# Patient Record
Sex: Male | Born: 1955 | Race: White | Hispanic: No | Marital: Married | Smoking: Never smoker
Health system: Southern US, Community
[De-identification: ages and names within clinical notes are randomized; demographics above are authoritative.]

## PROBLEM LIST (undated history)

## (undated) DIAGNOSIS — E119 Type 2 diabetes mellitus without complications: Secondary | ICD-10-CM

## (undated) DIAGNOSIS — M1990 Unspecified osteoarthritis, unspecified site: Secondary | ICD-10-CM

## (undated) DIAGNOSIS — Z791 Long term (current) use of non-steroidal anti-inflammatories (NSAID): Secondary | ICD-10-CM

## (undated) HISTORY — PX: HERNIA REPAIR: SHX51

## (undated) HISTORY — PX: HAND SURGERY: SHX662

## (undated) HISTORY — PX: OTHER SURGICAL HISTORY: SHX169

## (undated) HISTORY — DX: Long term (current) use of non-steroidal anti-inflammatories (nsaid): Z79.1

---

## 2005-10-28 ENCOUNTER — Encounter: Admission: RE | Admit: 2005-10-28 | Discharge: 2005-10-28 | Payer: Self-pay | Admitting: Orthopedic Surgery

## 2008-09-08 ENCOUNTER — Encounter: Admission: RE | Admit: 2008-09-08 | Discharge: 2008-09-08 | Payer: Self-pay | Admitting: General Surgery

## 2008-09-09 ENCOUNTER — Ambulatory Visit (HOSPITAL_BASED_OUTPATIENT_CLINIC_OR_DEPARTMENT_OTHER): Admission: RE | Admit: 2008-09-09 | Discharge: 2008-09-09 | Payer: Self-pay | Admitting: General Surgery

## 2009-04-10 ENCOUNTER — Encounter: Admission: RE | Admit: 2009-04-10 | Discharge: 2009-04-10 | Payer: Self-pay | Admitting: Orthopedic Surgery

## 2009-12-08 ENCOUNTER — Emergency Department (HOSPITAL_COMMUNITY): Admission: EM | Admit: 2009-12-08 | Discharge: 2009-12-09 | Payer: Self-pay | Admitting: Emergency Medicine

## 2010-09-01 ENCOUNTER — Encounter: Admission: RE | Admit: 2010-09-01 | Discharge: 2010-09-01 | Payer: Self-pay | Admitting: Family Medicine

## 2010-09-16 HISTORY — PX: OTHER SURGICAL HISTORY: SHX169

## 2010-10-07 ENCOUNTER — Inpatient Hospital Stay (HOSPITAL_COMMUNITY)
Admission: RE | Admit: 2010-10-07 | Discharge: 2010-10-09 | Payer: Self-pay | Source: Home / Self Care | Attending: Orthopedic Surgery | Admitting: Orthopedic Surgery

## 2010-11-09 NOTE — Discharge Summary (Signed)
NAME:  Joe Todd, Joe Todd                ACCOUNT NO.:  [id number]  MEDICAL RECORD NO.:  [id number]          PATIENT TYPE:  INP  LOCATION:  1607                         FACILITY:  [facility]  PHYSICIAN:  Nichalas Coin L. Rendall, M.D.  DATE OF BIRTH:  1955/11/07  DATE OF ADMISSION:  10/07/2010 DATE OF DISCHARGE:  10/09/2010                              DISCHARGE SUMMARY   ADMISSION DIAGNOSES: 1. End-stage osteoarthritis, left hip. 2. Obesity. 3. Borderline hypertension. 4. History of remote myocardial infarction.  DISCHARGE DIAGNOSES: 1. End-stage osteoarthritis, left hip, status post left total hip     arthroplasty. 2. Acute blood loss anemia secondary to surgery. 3. Obesity. 4. Borderline hypertension. 5. History of remote myocardial infarction.  SURGICAL PROCEDURES:  On October 07, 2010, Mr. Vitullo underwent a left total hip arthroplasty by Dr. Jonny Ruiz L. Rendall, assisted by Arnoldo Morale PA-C.  He had a DePuy Pinnacle sector II acetabular cup size 62 mm with 2 Pinnacle cancellus bone screw 6.5 x 30, two of those, with an apex hole eliminator and a pinnacle marathon acetabular liner plus 4, 10 degrees, 36 mm inner diameter, 62 mm outer diameter.  AML small stature 150 mm length, 43 mm offset size 18 femoral stem with a metal-on-metal femoral head, +8.5 neck length, 12/14 taper.  COMPLICATIONS:  None.  CONSULTS:  Occupational and Physical Therapy consult on October 08, 2010.  HISTORY OF PRESENT ILLNESS:  This 55 year old white male patient presented to Dr. Priscille Kluver with greater than 1 year history of gradual onset but stable left hip pain.  There is no known injury or prior surgery to the hip but the pain is now intermittent, sore to aching sensation to left groin without radiation.  It increases with prolonged walking and decreases with Motrin and rest.  The hip gives away on occasion and catches and keeps him up at night.  He has failed conservative treatment and because of that he is  presenting for left hip replacement.  HOSPITAL COURSE:  Mr. Giampietro tolerated the surgical procedure well without immediate postoperative complications.  He was transferred to the orthopedic floor.  On postop day #1, he was afebrile, vitals were stable.  He was doing well with ambulation.  Hemoglobin 11.2, hematocrit 33.5.  He was started on therapy per protocol.  On postop day #2, T max was 98.4, hemoglobin 10.7, hematocrit 32.3.  His dressing was changed.  Leg was neurovascularly intact.  He was doing well enough with therapy.  It was felt he was ready for DC home and was DC'ed home later that day.  DISCHARGE INSTRUCTIONS:  DIET:  He is to resume his regular prehospitalization diet.  MEDICATIONS:  Please see the home discharge med rec sheet for complete documentation of his medications but we did add Celebrex 200 mg, Robaxin 500 mg, Percocet 5/325, and Xarelto 10 mg.  He was initially started on the Xarelto for 12 days after surgery.  WOUND CARE:  Please see the white total joint discharge sheet for further wound care instructions.  ACTIVITY:  He can increase activity slowly.  He may go up and down steps.  He can use crutches  and walker.  No lifting or driving for 6 weeks and he can be weightbearing as tolerated on the left leg with use of a walker.  Please see the white total joint discharge sheet for further activity instructions.  FOLLOWUP:  He needs to follow up with Dr. Priscille Kluver in our office on Tuesday, October 19, 2010, and needs to call [phone] for that appointment.  He is arranged for home health PT per [facility].  LABORATORY DATA:  Hemoglobin and hematocrit ranged from 13.9 and 41 on the 14th to 10.7 and 32.3 on the 24th.  White count and platelets were within normal limits.  Glucose ranged from 192 on the 14th to 144 on the 24th.  Calcium dropped to a low of 8.3 on the 23rd and then to 8.1 on the 24th.  All other laboratory studies were within  normal limits.  X-ray taken of the left hip on October 07, 2010, showed satisfactory position of the left total hip.     Legrand Pitts Duffy, P.A.   ______________________________ Carlisle Beers. Priscille Kluver, M.D.    KED/MEDQ  D:  11/03/2010  T:  11/03/2010  Job:  [id number]  Electronically Signed by Arnoldo Morale P.A. on 11/09/2010 08:12:57 AM Electronically Signed by Erasmo Leventhal M.D. on 11/09/2010 09:38:28 AM

## 2010-12-27 LAB — CROSSMATCH: Antibody Screen: NEGATIVE

## 2010-12-27 LAB — BASIC METABOLIC PANEL
BUN: 11 mg/dL (ref 6–23)
Chloride: 102 mEq/L (ref 96–112)
GFR calc Af Amer: >60 mL/min (ref >60)
GFR calc non Af Amer: >60 mL/min (ref >60)
GFR calc non Af Amer: >60 mL/min (ref >60)
Glucose, Bld: 144 mg/dL — ABNORMAL HIGH (ref 70–99)
Potassium: 3.9 mEq/L (ref 3.5–5.1)
Potassium: 4.4 mEq/L (ref 3.5–5.1)
Sodium: 135 mEq/L (ref 135–145)

## 2010-12-27 LAB — CBC
HCT: 32.3 % — ABNORMAL LOW (ref 39.0–52.0)
HCT: 33.5 % — ABNORMAL LOW (ref 39.0–52.0)
Hemoglobin: 10.7 g/dL — ABNORMAL LOW (ref 13.0–17.0)
Platelets: 192 10*3/uL (ref 150–400)
RBC: 3.52 MIL/uL — ABNORMAL LOW (ref 4.22–5.81)
RDW: 12.9 % (ref 11.5–15.5)
WBC: 7.2 10*3/uL (ref 4.0–10.5)
WBC: 8.7 10*3/uL (ref 4.0–10.5)

## 2010-12-27 LAB — ABO/RH: ABO/RH(D): O NEG

## 2010-12-27 LAB — GLUCOSE, CAPILLARY

## 2010-12-28 LAB — SURGICAL PCR SCREEN
MRSA, PCR: NEGATIVE
Staphylococcus aureus: NEGATIVE

## 2010-12-28 LAB — COMPREHENSIVE METABOLIC PANEL
ALT: 66 U/L — ABNORMAL HIGH (ref 0–53)
AST: 34 U/L (ref 0–37)
Alkaline Phosphatase: 87 U/L (ref 39–117)
CO2: 26 mEq/L (ref 19–32)
Calcium: 9.1 mg/dL (ref 8.4–10.5)
Chloride: 108 mEq/L (ref 96–112)
GFR calc Af Amer: >60 mL/min (ref >60)
Sodium: 141 mEq/L (ref 135–145)
Total Bilirubin: 0.7 mg/dL (ref 0.3–1.2)

## 2010-12-28 LAB — APTT: aPTT: 26 seconds (ref 24–37)

## 2010-12-28 LAB — CBC
Hemoglobin: 13.9 g/dL (ref 13.0–17.0)
MCHC: 33.9 g/dL (ref 30.0–36.0)
RBC: 4.27 MIL/uL (ref 4.22–5.81)
WBC: 7.1 10*3/uL (ref 4.0–10.5)

## 2010-12-28 LAB — PROTIME-INR: INR: 0.93 (ref 0.00–1.49)

## 2010-12-28 LAB — URINALYSIS, ROUTINE W REFLEX MICROSCOPIC
Glucose, UA: 250 mg/dL — AB
Hgb urine dipstick: NEGATIVE
Specific Gravity, Urine: 1.028 (ref 1.005–1.030)

## 2010-12-28 LAB — DIFFERENTIAL
Basophils Absolute: 0 10*3/uL (ref 0.0–0.1)
Basophils Relative: 0 % (ref 0–1)
Lymphocytes Relative: 24 % (ref 12–46)
Monocytes Absolute: 0.6 10*3/uL (ref 0.1–1.0)
Monocytes Relative: 8 % (ref 3–12)
Neutro Abs: 4.6 10*3/uL (ref 1.7–7.7)
Neutrophils Relative %: 65 % (ref 43–77)

## 2011-03-01 NOTE — Op Note (Signed)
NAME:  Joe Todd, Joe Todd NO.:  [id number]   MEDICAL RECORD NO.:  [id number]          PATIENT TYPE:  AMB   LOCATION:  DSC                          FACILITY:  [facility]   PHYSICIAN:  Adolph Pollack, M.D.DATE OF BIRTH:  [birth year]   DATE OF PROCEDURE:  DATE OF DISCHARGE:                               OPERATIVE REPORT   PREOPERATIVE DIAGNOSIS:  Umbilical hernia (symptomatic).   POSTOPERATIVE DIAGNOSIS:  Umbilical hernia (symptomatic).   PROCEDURE:  Umbilical repair with mesh.   SURGEON:  Adolph Pollack, MD   ANESTHESIA:  General plus Marcaine local.   INDICATIONS:  This 55 year old man who has had umbilical bulge that is  becoming more uncomfortable for him.  It is reducible and consistent  with umbilical hernia.  He now presents for repair.  We discussed the  procedure risks and aftercare preoperatively.   TECHNIQUE:  He was brought to the operating room and placed supine on  the operating table and general anesthetic was administered.  The hair  in the periumbilical area was clipped and the area was sterilely prepped  and draped.  I did a periumbilical block using Marcaine solution  superficially and deep.  A curvilinear infraumbilical incision wound was  made dividing the skin and subcutaneous tissue down to the fascial  level.  I then dissected the umbilicus free from the fascia as the  hernia defect was just to the right of it.  I then exposed the hernia  defect and using electrocautery, I dissected the subcutaneous tissue off  the fascia for a distance about 4 cm around the hernia defect.  The  hernia defect measured about 2-2.5 cm.   I then primarily closed the hernia defect with 0 Surgilon sutures  leaving them long.  I brought a piece of 3 x 6 inch polypropylene mesh  into the field and cut it to appropriate size to allow for 4 cm of  overlap over the primary repair.  I then threaded the sutures up through  the mesh and tied the sutures down  anchoring the mesh directly over the  primary repair.  Following this, I used 0 Prolene to anchor the  periphery of the mesh to the fascia in a running fashion.  I then  trimmed the excess mesh.   I then anesthetized the fascia with Marcaine solution.  Hemostasis was  adequate.  The umbilicus was reattached the mesh with 3-0 Vicryl suture.  The subcutaneous tissue was closed over the mesh with a running 3-0  Vicryl suture.  The skin was closed with 4-0 Monocryl subcuticular  stitch.  Steri-Strips and sterile dressing were applied.   He tolerated the procedure without apparent complications and was taken  to the recovery room in satisfactory condition.      Adolph Pollack, M.D.  Electronically Signed     TJR/MEDQ  D:  09/09/2008  T:  09/09/2008  Job:  [id number]   cc:   Oley Balm. Georgina Pillion, M.D.

## 2011-07-20 LAB — DIFFERENTIAL
Basophils Absolute: 0
Basophils Relative: 0
Lymphocytes Relative: 23
Monocytes Absolute: 0.5
Neutro Abs: 4.3
Neutrophils Relative %: 68

## 2011-07-20 LAB — CBC
HCT: 46.4
Platelets: 257
RDW: 13.6
WBC: 6.4

## 2011-07-20 LAB — COMPREHENSIVE METABOLIC PANEL
ALT: 102 — ABNORMAL HIGH
AST: 49 — ABNORMAL HIGH
Albumin: 4
Alkaline Phosphatase: 77
Chloride: 105
Potassium: 4.2
Sodium: 139
Total Bilirubin: 1
Total Protein: 6.6

## 2011-07-20 LAB — POCT HEMOGLOBIN-HEMACUE: Hemoglobin: 14.9

## 2012-09-27 ENCOUNTER — Emergency Department (HOSPITAL_COMMUNITY)
Admission: EM | Admit: 2012-09-27 | Discharge: 2012-09-27 | Disposition: A | Discharge status: Home / Self Care | Payer: Managed Care, Other (non HMO) | Attending: Emergency Medicine | Admitting: Emergency Medicine

## 2012-09-27 ENCOUNTER — Emergency Department (HOSPITAL_COMMUNITY): Payer: Managed Care, Other (non HMO)

## 2012-09-27 ENCOUNTER — Encounter (HOSPITAL_COMMUNITY): Payer: Self-pay | Admitting: *Deleted

## 2012-09-27 DIAGNOSIS — M25549 Pain in joints of unspecified hand: Secondary | ICD-10-CM | POA: Insufficient documentation

## 2012-09-27 DIAGNOSIS — Z791 Long term (current) use of non-steroidal anti-inflammatories (NSAID): Secondary | ICD-10-CM | POA: Insufficient documentation

## 2012-09-27 DIAGNOSIS — R079 Chest pain, unspecified: Secondary | ICD-10-CM

## 2012-09-27 LAB — CBC
Hemoglobin: 14.9 g/dL (ref 13.0–17.0)
MCH: 30.8 pg (ref 26.0–34.0)
Platelets: 239 10*3/uL (ref 150–400)
RBC: 4.84 MIL/uL (ref 4.22–5.81)
WBC: 6.2 10*3/uL (ref 4.0–10.5)

## 2012-09-27 LAB — BASIC METABOLIC PANEL
CO2: 27 mEq/L (ref 19–32)
Calcium: 9.4 mg/dL (ref 8.4–10.5)
Glucose, Bld: 101 mg/dL — ABNORMAL HIGH (ref 70–99)
Potassium: 4.4 mEq/L (ref 3.5–5.1)
Sodium: 136 mEq/L (ref 135–145)

## 2012-09-27 LAB — POCT I-STAT TROPONIN I
Troponin i, poc: 0.01 ng/mL (ref 0.00–0.08)
Troponin i, poc: 0 ng/mL (ref 0.00–0.08)

## 2012-09-27 MED ORDER — ASPIRIN 81 MG PO CHEW
324.0000 mg | CHEWABLE_TABLET | Freq: Once | ORAL | Status: AC
  Administered 2012-09-27: 324 mg via ORAL
  Filled 2012-09-27: qty 4

## 2012-09-27 NOTE — ED Notes (Signed)
Pt came from work with reports of left chest pain that began as constant and sharp at around 4pm, has since subsided but pt reports that left hand also began throbbing intermittently on the way here. Pt denies pain radiation, shortness of breath, N/V, dizziness or diaphoresis. 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

## 2012-09-27 NOTE — ED Provider Notes (Signed)
Medical screening examination/treatment/procedure(s) were performed by non-physician practitioner and as supervising physician I was immediately available for consultation/collaboration.   Richardean Canal, MD 09/27/12 2227

## 2012-09-27 NOTE — ED Provider Notes (Signed)
History     CSN: [id number]  Arrival date & time 09/27/12  1703   First MD Initiated Contact with Patient 09/27/12 1740      Chief Complaint  Patient presents with  . Chest Pain    left chest  . Hand Pain    left    (Consider location/radiation/quality/duration/timing/severity/associated sxs/prior treatment) HPI Comments: Patient with history of "silent MI" presents with complaint of acute onset of left lateral chest pain, inferior axilla, that began approximately 4 PM. Pain lasted for approximately 90 minutes before gradually improving. Pain felt dull like a "pulled muscle". While this was happening the patient began having a throbbing pain in his left hand. No treatments prior to arrival. No diaphoresis, palpitations, nausea or vomiting. Patient states he has had a history of a stress test but does not remember results. No history of catheterization. No smoking, diabetes, high cholesterol, hypertension, family history of MI. The onset of this condition was acute. The course is improving. Aggravating factors: none. Alleviating factors: none. Patient has not had these symptoms in the past.    Patient is a 56 y.o. male presenting with chest pain and hand pain. The history is provided by the patient.  Chest Pain Pertinent negatives for primary symptoms include no fever, no shortness of breath, no cough, no palpitations, no abdominal pain, no nausea and no vomiting.  Pertinent negatives for associated symptoms include no diaphoresis.    Hand Pain Associated symptoms include chest pain. Pertinent negatives include no abdominal pain, coughing, diaphoresis, fever, nausea, neck pain, rash or vomiting.    History reviewed. No pertinent past medical history.  Past Surgical History  Procedure Date  . Hernia repair   . Left total hip replacement 09/2010  . Bilateral shoulder surgery     History reviewed. No pertinent family history.  History  Substance Use Topics  . Smoking status:  Never Smoker   . Smokeless tobacco: Never Used  . Alcohol Use: Yes     Comment: every weekend      Review of Systems  Constitutional: Negative for fever and diaphoresis.  HENT: Negative for neck pain.   Eyes: Negative for redness.  Respiratory: Negative for cough and shortness of breath.   Cardiovascular: Positive for chest pain. Negative for palpitations and leg swelling.  Gastrointestinal: Negative for nausea, vomiting and abdominal pain.  Genitourinary: Negative for dysuria.  Musculoskeletal: Negative for back pain.  Skin: Negative for rash.  Neurological: Negative for syncope and light-headedness.    Allergies  Penicillins  Home Medications   Current Outpatient Rx  Name  Route  Sig  Dispense  Refill  . IBUPROFEN 200 MG PO TABS   Oral   Take 800 mg by mouth 2 (two) times daily. Pain           BP 134/81  Pulse 70  Temp 98 F (36.7 C) (Oral)  Resp 16  Wt 250 lb (113.399 kg)  SpO2 100%  Physical Exam  Nursing note and vitals reviewed. Constitutional: He appears well-developed and well-nourished.  HENT:  Head: Normocephalic and atraumatic.  Mouth/Throat: Mucous membranes are normal. Mucous membranes are not dry.  Eyes: Conjunctivae normal are normal.  Neck: Trachea normal and normal range of motion. Neck supple. Normal carotid pulses and no JVD present. No muscular tenderness present. Carotid bruit is not present. No tracheal deviation present.  Cardiovascular: Normal rate, regular rhythm, S1 normal, S2 normal, normal heart sounds and intact distal pulses.  Exam reveals no distant heart sounds and  no decreased pulses.   No murmur heard. Pulmonary/Chest: Effort normal and breath sounds normal. No respiratory distress. He has no wheezes. He exhibits no tenderness.  Abdominal: Soft. Normal aorta and bowel sounds are normal. There is no tenderness. There is no rebound and no guarding.  Musculoskeletal: He exhibits no edema.  Neurological: He is alert.  Skin: Skin  is warm and dry. He is not diaphoretic. No cyanosis. No pallor.  Psychiatric: He has a normal mood and affect.    ED Course  Procedures (including critical care time)  Labs Reviewed  BASIC METABOLIC PANEL - Abnormal; Notable for the following:    Glucose, Bld 101 (*)     GFR calc non Af Amer 75 (*)     GFR calc Af Amer 87 (*)     All other components within normal limits  CBC  POCT I-STAT TROPONIN I   Dg Chest 2 View  09/27/2012  *RADIOLOGY REPORT*  Clinical Data: Left chest pain  CHEST - 2 VIEW  Comparison: 09/01/2010  Findings: Upper-normal size of cardiac silhouette. Tortuous aorta. Pulmonary vascularity normal. Lungs clear. No pleural effusion or pneumothorax. Osseous demineralization.  IMPRESSION: No acute abnormalities.   Original Report Authenticated By: Ulyses Southward, M.D.      1. Chest pain     5:57 PM Patient seen and examined. Work-up initiated. Medications ordered. EKG reviewed.  Vital signs reviewed and are as follows: Filed Vitals:   09/27/12 1729  BP: 134/81  Pulse: 70  Temp: 98 F (36.7 C)  Resp: 16    Date: 09/27/2012  Rate: 67  Rhythm: normal sinus rhythm  QRS Axis: normal  Intervals: normal  ST/T Wave abnormalities: normal  Conduction Disutrbances:none  Narrative Interpretation: inferior q-waves  Old EKG Reviewed: unchanged from 09/08/08  8:32 PM Patient was d/w Dr. Silverio Lay. Will check 3hr marker and d/c if neg. Patient has PCP/cardiology f/u. Patient informed of results to this point and agrees with plan.   Handoff to [employer] who will follow-up on remaining troponin.    MDM  CP in patient with no cardiac risk factors, but ??? previous MI. Chest pain is atypical. EKG unchanged. Awaiting 2nd troponin. Feel patient is low-risk and can follow-up as outpatient if 2nd trop is neg. CP resolved.          Renne Crigler, [place] 09/27/12 2041

## 2012-09-27 NOTE — ED Notes (Signed)
Pt refusing IV 

## 2012-12-17 ENCOUNTER — Emergency Department (HOSPITAL_COMMUNITY)
Admission: EM | Admit: 2012-12-17 | Discharge: 2012-12-17 | Disposition: A | Discharge status: Home / Self Care | Payer: Managed Care, Other (non HMO) | Attending: Emergency Medicine | Admitting: Emergency Medicine

## 2012-12-17 ENCOUNTER — Emergency Department (HOSPITAL_COMMUNITY): Payer: Managed Care, Other (non HMO)

## 2012-12-17 ENCOUNTER — Encounter (HOSPITAL_COMMUNITY): Payer: Self-pay | Admitting: Emergency Medicine

## 2012-12-17 DIAGNOSIS — K5289 Other specified noninfective gastroenteritis and colitis: Secondary | ICD-10-CM | POA: Insufficient documentation

## 2012-12-17 DIAGNOSIS — R000 Tachycardia, unspecified: Secondary | ICD-10-CM | POA: Insufficient documentation

## 2012-12-17 DIAGNOSIS — Z7982 Long term (current) use of aspirin: Secondary | ICD-10-CM | POA: Insufficient documentation

## 2012-12-17 DIAGNOSIS — Z791 Long term (current) use of non-steroidal anti-inflammatories (NSAID): Secondary | ICD-10-CM | POA: Insufficient documentation

## 2012-12-17 DIAGNOSIS — R509 Fever, unspecified: Secondary | ICD-10-CM | POA: Insufficient documentation

## 2012-12-17 DIAGNOSIS — R1084 Generalized abdominal pain: Secondary | ICD-10-CM | POA: Insufficient documentation

## 2012-12-17 DIAGNOSIS — Z96649 Presence of unspecified artificial hip joint: Secondary | ICD-10-CM | POA: Insufficient documentation

## 2012-12-17 DIAGNOSIS — R5381 Other malaise: Secondary | ICD-10-CM | POA: Insufficient documentation

## 2012-12-17 LAB — COMPREHENSIVE METABOLIC PANEL
ALT: 33 U/L (ref 0–53)
AST: 24 U/L (ref 0–37)
Alkaline Phosphatase: 56 U/L (ref 39–117)
Calcium: 8.5 mg/dL (ref 8.4–10.5)
Potassium: 3.7 mEq/L (ref 3.5–5.1)
Sodium: 134 mEq/L — ABNORMAL LOW (ref 135–145)
Total Protein: 6.4 g/dL (ref 6.0–8.3)

## 2012-12-17 LAB — URINALYSIS, ROUTINE W REFLEX MICROSCOPIC
Glucose, UA: NEGATIVE mg/dL
Hgb urine dipstick: NEGATIVE
Specific Gravity, Urine: 1.037 — ABNORMAL HIGH (ref 1.005–1.030)
Urobilinogen, UA: 0.2 mg/dL (ref 0.0–1.0)
pH: 5.5 (ref 5.0–8.0)

## 2012-12-17 LAB — CBC WITH DIFFERENTIAL/PLATELET
Basophils Absolute: 0 10*3/uL (ref 0.0–0.1)
Eosinophils Absolute: 0 10*3/uL (ref 0.0–0.7)
Eosinophils Relative: 0 % (ref 0–5)
Lymphocytes Relative: 3 % — ABNORMAL LOW (ref 12–46)
MCH: 31.7 pg (ref 26.0–34.0)
MCV: 92.1 fL (ref 78.0–100.0)
Neutrophils Relative %: 92 % — ABNORMAL HIGH (ref 43–77)
Platelets: 198 10*3/uL (ref 150–400)
RDW: 12.8 % (ref 11.5–15.5)
WBC: 8.7 10*3/uL (ref 4.0–10.5)

## 2012-12-17 LAB — URINE MICROSCOPIC-ADD ON

## 2012-12-17 LAB — TROPONIN I: Troponin I: <0.3 ng/mL (ref <0.30)

## 2012-12-17 LAB — LACTIC ACID, PLASMA: Lactic Acid, Venous: 1 mmol/L (ref 0.5–2.2)

## 2012-12-17 MED ORDER — SODIUM CHLORIDE 0.9 % IV BOLUS (SEPSIS): 1000.0000 mL | Freq: Once | INTRAVENOUS | Status: DC

## 2012-12-17 MED ORDER — SODIUM CHLORIDE 0.9 % IV BOLUS (SEPSIS)
1000.0000 mL | Freq: Once | INTRAVENOUS | Status: AC
  Administered 2012-12-17: 1000 mL via INTRAVENOUS

## 2012-12-17 MED ORDER — ONDANSETRON HCL 4 MG/2ML IJ SOLN
4.0000 mg | Freq: Once | INTRAMUSCULAR | Status: AC
  Administered 2012-12-17: 4 mg via INTRAVENOUS
  Filled 2012-12-17: qty 2

## 2012-12-17 MED ORDER — ONDANSETRON HCL 4 MG PO TABS: 4.0000 mg | ORAL_TABLET | Freq: Four times a day (QID) | ORAL | Status: DC

## 2012-12-17 MED ORDER — ACETAMINOPHEN 325 MG PO TABS
650.0000 mg | ORAL_TABLET | Freq: Once | ORAL | Status: AC
  Administered 2012-12-17: 650 mg via ORAL
  Filled 2012-12-17: qty 2

## 2012-12-17 NOTE — ED Notes (Signed)
States that he experienced abd pain and GERD last pm. He took [place] with no results. States that when trying to go back to bed he had weakness. States that he experienced a lot of belching that is very acidic tasting. States that he still has extreme weakness.

## 2012-12-17 NOTE — ED Provider Notes (Addendum)
History     CSN: [id number]  Arrival date & time 12/17/12  1726   First MD Initiated Contact with Patient 12/17/12 1808      Chief Complaint  Patient presents with  . Diarrhea  . Weakness    (Consider location/radiation/quality/duration/timing/severity/associated sxs/prior treatment) HPI Comments: States he feels totally drained and weak all over with no energy  Patient is a 57 y.o. male presenting with diarrhea and weakness. The history is provided by the patient.  Diarrhea Quality:  Watery Severity:  Moderate Onset quality:  Sudden Number of episodes:  5 Duration:  1 day Timing:  Constant Progression:  Unchanged Relieved by:  Nothing Worsened by:  Nothing tried Ineffective treatments:  None tried Associated symptoms: abdominal pain, chills and fever   Associated symptoms: no recent cough, no headaches, no myalgias, no URI and no vomiting   Associated symptoms comment:  Mild diffuse abd pain Abdominal pain:    Location:  Generalized   Quality:  Dull   Severity:  Mild   Onset quality:  Unable to specify   Timing:  Sporadic   Progression:  Partially resolved Risk factors: no recent antibiotic use, no sick contacts, no suspicious food intake and no travel to endemic areas   Weakness This is a new problem. The current episode started 12 to 24 hours ago. The problem occurs constantly. The problem has been gradually worsening. Associated symptoms include abdominal pain. Pertinent negatives include no headaches. The symptoms are aggravated by exertion. He has tried nothing for the symptoms. The treatment provided no relief.    No past medical history on file.  Past Surgical History  Procedure Laterality Date  . Hernia repair    . 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 Bilateral shoulder surgery      No family history on file.  History  Substance Use Topics  . Smoking status: Never Smoker   . Smokeless tobacco: Never Used  . Alcohol Use: Yes     Comment: every  weekend      Review of Systems  Constitutional: Positive for fever and chills.  Gastrointestinal: Positive for abdominal pain and diarrhea. Negative for vomiting.  Musculoskeletal: Negative for myalgias.  Neurological: Positive for weakness. Negative for headaches.  All other systems reviewed and are negative.    Allergies  Penicillins  Home Medications   Current Outpatient Rx  Name  Route  Sig  Dispense  Refill  . aspirin 81 MG chewable tablet   Oral   Chew 81 mg by mouth daily.         Marland Kitchen ibuprofen (ADVIL,MOTRIN) 200 MG tablet   Oral   Take 800 mg by mouth 2 (two) times daily. Pain           BP 122/94  Pulse 104  Temp(Src) 101.2 F (38.4 C) (Oral)  Resp 19  SpO2 100%  Physical Exam  Nursing note and vitals reviewed. Constitutional: He is oriented to person, place, and time. He appears well-developed and well-nourished. No distress.  HENT:  Head: Normocephalic and atraumatic.  Mouth/Throat: Oropharynx is clear and moist.  Eyes: Conjunctivae and EOM are normal. Pupils are equal, round, and reactive to light.  Neck: Normal range of motion. Neck supple.  Cardiovascular: Regular rhythm and intact distal pulses.  Tachycardia present.   No murmur heard. Pulmonary/Chest: Effort normal and breath sounds normal. No respiratory distress. He has no wheezes. He has no rales.  Abdominal: Soft. He exhibits no distension. There is generalized tenderness. There is  no rebound and no guarding.  Mild generalized pain without rebound or guarding  Musculoskeletal: Normal range of motion. He exhibits no edema and no tenderness.  Neurological: He is alert and oriented to person, place, and time.  Skin: Skin is warm and dry. No rash noted. No erythema.  Psychiatric: He has a normal mood and affect. His behavior is normal.    ED Course  Procedures (including critical care time)  Labs Reviewed  CBC WITH DIFFERENTIAL - Abnormal; Notable for the following:    Neutrophils  Relative 92 (*)    Neutro Abs 8.0 (*)    Lymphocytes Relative 3 (*)    Lymphs Abs 0.3 (*)    All other components within normal limits  COMPREHENSIVE METABOLIC PANEL - Abnormal; Notable for the following:    Sodium 134 (*)    Glucose, Bld 158 (*)    All other components within normal limits  URINALYSIS, ROUTINE W REFLEX MICROSCOPIC - Abnormal; Notable for the following:    Specific Gravity, Urine 1.037 (*)    Bilirubin Urine SMALL (*)    Ketones, ur TRACE (*)    Protein, ur 30 (*)    All other components within normal limits  URINE MICROSCOPIC-ADD ON - Abnormal; Notable for the following:    Squamous Epithelial / LPF FEW (*)    All other components within normal limits  LIPASE, BLOOD  LACTIC ACID, PLASMA  TROPONIN I   Dg Abd Acute W/chest  12/17/2012  *RADIOLOGY REPORT*  Clinical Data: Nausea and diarrhea.  Weakness.  ACUTE ABDOMEN SERIES (ABDOMEN 2 VIEW & CHEST 1 VIEW)  Comparison: 09/27/2012.  Findings: Frontal chest shows low volumes.  No edema or focal airspace consolidation. The cardiopericardial silhouette is enlarged. Imaged bony structures of the thorax are intact.  Upright film shows no evidence for intraperitoneal free air. There is some scattered air-fluid levels in the right colon which can be normal.  No overt gaseous dilatation of large or small bowel. There is some mild distention of small bowel in the central abdomen.  Visualized bony structures are unremarkable.  The patient has a left total hip replacement, incompletely visualized.  IMPRESSION: No acute cardiopulmonary process.  No intraperitoneal free air.  No overt bowel obstruction.  There is some mild gaseous distention of large and small bowel.  Gastroenteritis would be a consideration.   Original Report Authenticated By: Kennith Center, M.D.     Date: 12/17/2012  Rate: 104  Rhythm: sinus tachycardia  QRS Axis: left  Intervals: normal  ST/T Wave abnormalities: normal  Conduction Disutrbances:none  Narrative  Interpretation:   Old EKG Reviewed: unchanged    1. Enteritis       MDM   Pt with symptoms most consistent with a viral process with fever/nausea/diarrhea.  Denies bad food exposure and recent travel out of the country.  No recent abx.  No hx concerning for GU pathology or kidney stones.  Pt is awake and alert on exam without peritoneal signs.  Temp of 101.  States feels weak all over but denies CP and mild SOB.  AAS shows signs of gastroenteritis which is what his hx is most suggestive for.  Normal EKG and neg troponin with sx starting last night. After IVF, zofran pt feeling much better.  8:59 PM Tolerating pos and feeling better.  Will d/c home.         Gwyneth Sprout, MD 12/17/12 1610  Gwyneth Sprout, MD 12/17/12 2100

## 2012-12-17 NOTE — ED Notes (Signed)
Patient is alert and oriented x3.  He was given DC instructions and follow up visit instructions.  Patient gave verbal understanding.  He was DC ambulatory under his own power to home.  V/S stable.  He was not showing any signs of distress on DC 

## 2012-12-17 NOTE — ED Notes (Signed)
Writer gave pt water to see how pt will be able to tolerate fluid.

## 2014-06-15 ENCOUNTER — Encounter: Payer: Self-pay | Admitting: *Deleted

## 2015-10-27 ENCOUNTER — Ambulatory Visit (INDEPENDENT_AMBULATORY_CARE_PROVIDER_SITE_OTHER): Admitting: Family Medicine

## 2015-10-27 VITALS — BP 126/72 | HR 62 | Temp 98.1°F | Resp 16 | Wt 279.4 lb

## 2015-10-27 DIAGNOSIS — S83421A Sprain of lateral collateral ligament of right knee, initial encounter: Secondary | ICD-10-CM | POA: Diagnosis not present

## 2015-10-27 DIAGNOSIS — M25561 Pain in right knee: Secondary | ICD-10-CM | POA: Diagnosis not present

## 2015-10-27 NOTE — Progress Notes (Signed)
   Subjective:    Patient ID: Joe Todd, male    DOB: 11/02/1955, 60 y.o.   MRN: [id number] By signing my name below, I, Joe Todd, attest that this documentation has been prepared under the direction and in the presence of Robyn Haber, MD.  Electronically Signed: Zola Todd, Medical Scribe. 10/27/2015. 10:33 AM.  HPI HPI Comments: Joe Todd is a 60 y.o. male who presents to the [facility] complaining of sudden onset, intermittent, pulsating right knee pain secondary to an injury that occurred while at work yesterday. 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 He then tried to stay balanced by moving his right leg over, but his right foot was caught on the ground (he was wearing cleats) and he ended up twisting his right knee. He has been able to ambulate but with a limp. Patient had a full left hip replacement done 4 years ago.  Patient is from [place]. He has been in [place] for 28 years.  Review of Systems  Constitutional: Negative for fever.  Musculoskeletal: Positive for arthralgias.       Objective:   Physical Exam CONSTITUTIONAL: Well developed/well nourished HEAD: Normocephalic/atraumatic EYES: EOM/PERRL ENMT: Mucous membranes moist NECK: supple no meningeal signs SPINE: entire spine nontender CV: S1/S2 noted, no murmurs/rubs/gallops noted LUNGS: Lungs are clear to auscultation bilaterally, no apparent distress ABDOMEN: soft, nontender, no rebound or guarding GU: no cva tenderness NEURO: Pt is awake/alert, moves all extremitiesx4 EXTREMITIES: pulses normal, full ROM, no effusion, no lig laxity of right knee.  Tender MCL right knee SKIN: warm, color normal PSYCH: no abnormalities of mood noted      Assessment & Plan:   Hinged knee brace.  Out of work until Sunday when he will come back for recheck  Robyn Haber, MD

## 2015-11-01 ENCOUNTER — Ambulatory Visit (INDEPENDENT_AMBULATORY_CARE_PROVIDER_SITE_OTHER): Admitting: Family Medicine

## 2015-11-01 VITALS — BP 120/80 | HR 74 | Temp 98.3°F | Resp 20 | Ht 72.0 in | Wt 285.2 lb

## 2015-11-01 DIAGNOSIS — M25561 Pain in right knee: Secondary | ICD-10-CM | POA: Diagnosis not present

## 2015-11-01 DIAGNOSIS — S83421A Sprain of lateral collateral ligament of right knee, initial encounter: Secondary | ICD-10-CM

## 2015-11-01 NOTE — Progress Notes (Signed)
Patient ID: Joe Todd, male   DOB: 10/17/1956, 60 y.o.   MRN: [id number]   This chart was scribed for Robyn Haber, MD by [facility], medical scribe at [facility].The patient was seen in exam room 14 and the patient's care was started at 9:41 AM.  Patient ID: Joe Todd MRN: [id number], DOB: 1956/03/23, 60 y.o. Date of Encounter: 11/01/2015  Primary Physician: No primary care provider on file.  Chief Complaint:  Chief Complaint  Patient presents with   Wound Check    right knee    HPI:  Joe Todd is a 60 y.o. male who presents to [facility] for a follow up regarding a right knee injury which occurred while at work. Today he is 75% improved, and is able to walk on a straight surface with an occasional sharp pain in his right knee. Also, stepping up, getting up gives him some pain. Taking ibuprofen for relief. He works as a Freight forwarder.   History reviewed. No pertinent past medical history.   Home Meds: Prior to Admission medications   Medication Sig Start Date End Date Taking? Authorizing Provider  aspirin 81 MG chewable tablet Chew 81 mg by mouth daily. Reported on 10/27/2015   Yes Historical Provider, MD  ibuprofen (ADVIL,MOTRIN) 200 MG tablet Take 800 mg by mouth 2 (two) times daily. Pain   Yes Historical Provider, MD  ondansetron (ZOFRAN) 4 MG tablet Take 1 tablet (4 mg total) by mouth every 6 (six) hours. Patient not taking: Reported on 10/27/2015 12/17/12   Blanchie Dessert, MD    Allergies:  Allergies  Allergen Reactions   Penicillins Other (See Comments)    Not remember    Social History   Social History   Marital Status: Married    Spouse Name: N/A   Number of Children: N/A   Years of Education: N/A   Occupational History   Not on file.   Social History Main Topics   Smoking status: Never Smoker    Smokeless tobacco: Never Used   Alcohol Use: Yes     Comment: every weekend   Drug Use: No   Sexual  Activity: Not on file   Other Topics Concern   Not on file   Social History Narrative    Review of Systems: Constitutional: negative for chills, fever, night sweats, weight changes, or fatigue  HEENT: negative for vision changes, hearing loss, congestion, rhinorrhea, ST, epistaxis, or sinus pressure Cardiovascular: negative for chest pain or palpitations Respiratory: negative for hemoptysis, wheezing, shortness of breath, or cough Abdominal: negative for abdominal pain, nausea, vomiting, diarrhea, or constipation Dermatological: negative for rash Msk: Positive for arthralgias. Neurologic: negative for headache, dizziness, or syncope All other systems reviewed and are otherwise negative with the exception to those above and in the HPI.  Physical Exam: Blood pressure 120/80, pulse 74, temperature 98.3 F (36.8 C), temperature source Oral, resp. rate 20, height 6' (1.829 m), weight 285 lb 3.2 oz (129.366 kg), SpO2 96 %., Body mass index is 38.67 kg/(m^2). General: Well developed, well nourished, in no acute distress. Head: Normocephalic, atraumatic, eyes without discharge, sclera non-icteric, nares are without discharge. Bilateral auditory canals clear, TM's are without perforation, pearly grey and translucent with reflective cone of light bilaterally. Oral cavity moist, posterior pharynx without exudate, erythema, peritonsillar abscess, or post nasal drip.  Neck: Supple. No thyromegaly. Full ROM. No lymphadenopathy. Lungs: Clear bilaterally to auscultation without wheezes, rales, or rhonchi. Breathing is unlabored. Heart: RRR  with S1 S2. No murmurs, rubs, or gallops appreciated. Abdomen: Soft, non-tender, non-distended with normoactive bowel sounds. No hepatomegaly. No rebound/guarding. No obvious abdominal masses. Msk:  Strength and tone normal for age. Extremities/Skin: Warm and dry. No clubbing or cyanosis. No edema. No rashes or suspicious lesions. Tender MCL of the right knee. Neuro:  Alert and oriented X 3. Moves all extremities spontaneously. Gait is normal. CNII-XII grossly in tact. Psych:  Responds to questions appropriately with a normal affect.   Labs:  ASSESSMENT AND PLAN:  60 y.o. year old male with   By signing my name below, I, Nadim Abuhashem, attest that this documentation has been prepared under the direction and in the presence of Robyn Haber, MD.  Electronically Signed: Lora Havens, medical scribe. 11/01/2015 9:52 AM.    This chart was scribed in my presence and reviewed by me personally.    ICD-9-CM ICD-10-CM   1. Knee pain, acute, right 719.46 M25.561   2. Tear, knee, lateral collateral ligament, right, initial encounter 844.0 S83.421A    Follow up one week.  Out of work until then  Signed, Robyn Haber, MD

## 2015-11-08 ENCOUNTER — Ambulatory Visit (INDEPENDENT_AMBULATORY_CARE_PROVIDER_SITE_OTHER): Admitting: Family Medicine

## 2015-11-08 VITALS — BP 122/80 | HR 70 | Temp 98.0°F | Resp 16 | Ht 72.0 in | Wt 285.0 lb

## 2015-11-08 DIAGNOSIS — M25561 Pain in right knee: Secondary | ICD-10-CM | POA: Diagnosis not present

## 2015-11-08 NOTE — Progress Notes (Signed)
   By signing my name below, I, Moises Blood, attest that this documentation has been prepared under the direction and in the presence of Robyn Haber, MD. Electronically Signed: Moises Blood, [place]. 11/08/2015 , 1:22 PM .  Patient was seen in room 6 .   Patient ID: Joe Todd MRN: [id number], DOB: 26-Feb-1956, 60 y.o. Date of Encounter: 11/08/2015  Primary Physician: No primary care provider on file.  Chief Complaint:  Chief Complaint  Patient presents with  . Follow-up    rt. knee, x 2 weeks     HPI:  Joe Todd is a 60 y.o. male who presents to [facility] with workers' comp for follow up on right knee that occurred 2 weeks ago. He's feeling a lot better. He hasn't felt new pain.  Initial date of injury was 10/26/2015.   He works as a Freight forwarder.  He is from [place], and has been in [place] for 28 years now.    Allergies:  Allergies  Allergen Reactions  . Penicillins Other (See Comments)    Not remember   Review of Systems: Constitutional: negative for fever, chills, night sweats, weight changes, or fatigue  HEENT: negative for vision changes, hearing loss, congestion, rhinorrhea, ST, epistaxis, or sinus pressure Cardiovascular: negative for chest pain or palpitations Respiratory: negative for hemoptysis, wheezing, shortness of breath, or cough Abdominal: negative for abdominal pain, nausea, vomiting, diarrhea, or constipation Dermatological: negative for rash Neurologic: negative for headache, dizziness, or syncope All other systems reviewed and are otherwise negative with the exception to those above and in the HPI.  Physical Exam: Blood pressure 122/80, pulse 70, temperature 98 F (36.7 C), temperature source Oral, resp. rate 16, height 6' (1.829 m), weight 285 lb (129.275 kg), SpO2 98 %., Body mass index is 38.64 kg/(m^2). General: Well developed, well nourished, in no acute distress. Head: Normocephalic, atraumatic, eyes without  discharge, sclera non-icteric, nares are without discharge. Bilateral auditory canals clear, TM's are without perforation, pearly grey and translucent with reflective cone of light bilaterally. Oral cavity moist, posterior pharynx without exudate, erythema, peritonsillar abscess, or post nasal drip.  Neck: Supple. No thyromegaly. Full ROM. No lymphadenopathy.  Msk:  Strength and tone normal for age. Normal right knee exam Extremities/Skin: Warm and dry. No clubbing or cyanosis. No edema. No rashes or suspicious lesions. Neuro: Alert and oriented X 3. Moves all extremities spontaneously. Gait is normal. CNII-XII grossly in tact. Psych:  Responds to questions appropriately with a normal affect.    ASSESSMENT AND PLAN:  60 y.o. year old male with resolved right knee strain  Knee pain, acute, right  This chart was scribed in my presence and reviewed by me personally.   Signed, Robyn Haber, MD 11/08/2015 1:22 PM

## 2015-11-20 ENCOUNTER — Encounter: Payer: Self-pay | Admitting: Internal Medicine

## 2015-11-20 ENCOUNTER — Ambulatory Visit (INDEPENDENT_AMBULATORY_CARE_PROVIDER_SITE_OTHER): Payer: 59 | Admitting: Internal Medicine

## 2015-11-20 VITALS — BP 136/70 | HR 76 | Temp 98.0°F | Ht 70.25 in | Wt 281.0 lb

## 2015-11-20 DIAGNOSIS — R739 Hyperglycemia, unspecified: Secondary | ICD-10-CM | POA: Diagnosis not present

## 2015-11-20 DIAGNOSIS — E119 Type 2 diabetes mellitus without complications: Secondary | ICD-10-CM

## 2015-11-20 LAB — COMPLETE METABOLIC PANEL WITH GFR
ALT: 88 U/L — AB (ref 9–46)
AST: 47 U/L — ABNORMAL HIGH (ref 10–35)
Albumin: 3.9 g/dL (ref 3.6–5.1)
Alkaline Phosphatase: 87 U/L (ref 40–115)
BUN: 16 mg/dL (ref 7–25)
CHLORIDE: 103 mmol/L (ref 98–110)
CO2: 28 mmol/L (ref 20–31)
Calcium: 8.9 mg/dL (ref 8.6–10.3)
Creat: 0.99 mg/dL (ref 0.70–1.25)
GFR, EST NON AFRICAN AMERICAN: 82 mL/min (ref >=60)
Glucose, Bld: 215 mg/dL — ABNORMAL HIGH (ref 65–99)
POTASSIUM: 4.5 mmol/L (ref 3.5–5.3)
Sodium: 139 mmol/L (ref 135–146)
Total Bilirubin: 1 mg/dL (ref 0.2–1.2)
Total Protein: 5.9 g/dL — ABNORMAL LOW (ref 6.1–8.1)

## 2015-11-20 LAB — HEMOGLOBIN A1C: HEMOGLOBIN A1C: 8.4 % — AB (ref 4.6–6.5)

## 2015-11-20 LAB — POCT GLYCOSYLATED HEMOGLOBIN (HGB A1C): HEMOGLOBIN A1C: 7.8

## 2015-11-20 MED ORDER — GLUCOSE BLOOD VI STRP: ORAL_STRIP | Status: DC

## 2015-11-20 MED ORDER — ONETOUCH LANCETS MISC: Status: DC

## 2015-11-20 MED ORDER — METFORMIN HCL 500 MG PO TABS: 1000.0000 mg | ORAL_TABLET | Freq: Two times a day (BID) | ORAL | Status: DC

## 2015-11-20 NOTE — Patient Instructions (Signed)
Please start Metformin 500 mg with dinner x 4 days. If you tolerate this well, add another Metformin tablet (500 mg) with breakfast x 4 days. If you tolerate this well, add another metformin tablet with dinner (total 1000 mg) x 4 days. If you tolerate this well, add another metformin tablet with breakfast (total 1000 mg). Continue with 1000 mg of metformin 2x a day with breakfast and dinner.  Check sugars 1x a day, rotating check times.  Please let me know if the sugars are consistently <80 or >200.  Please schedule an appt with Antonieta Iba with nutrition.  Please return in 1.5 months with your sugar log.   PATIENT INSTRUCTIONS FOR TYPE 2 DIABETES:  **Please join MyChart!** - see attached instructions about how to join if you have not done so already.  DIET AND EXERCISE Diet and exercise is an important part of diabetic treatment.  We recommended aerobic exercise in the form of brisk walking (working between 40-60% of maximal aerobic capacity, similar to brisk walking) for 150 minutes per week (such as 30 minutes five days per week) along with 3 times per week performing 'resistance' training (using various gauge rubber tubes with handles) 5-10 exercises involving the major muscle groups (upper body, lower body and core) performing 10-15 repetitions (or near fatigue) each exercise. Start at half the above goal but build slowly to reach the above goals. If limited by weight, joint pain, or disability, we recommend daily walking in a swimming pool with water up to waist to reduce pressure from joints while allow for adequate exercise.    BLOOD GLUCOSES Monitoring your blood glucoses is important for continued management of your diabetes. Please check your blood glucoses 2-4 times a day: fasting, before meals and at bedtime (you can rotate these measurements - e.g. one day check before the 3 meals, the next day check before 2 of the meals and before bedtime, etc.).   HYPOGLYCEMIA (low blood  sugar) Hypoglycemia is usually a reaction to not eating, exercising, or taking too much insulin/ other diabetes drugs.  Symptoms include tremors, sweating, hunger, confusion, headache, etc. Treat IMMEDIATELY with 15 grams of Carbs: . 4 glucose tablets .  cup regular juice/soda . 2 tablespoons raisins . 4 teaspoons sugar . 1 tablespoon honey Recheck blood glucose in 15 mins and repeat above if still symptomatic/blood glucose <100.  RECOMMENDATIONS TO REDUCE YOUR RISK OF DIABETIC COMPLICATIONS: * Take your prescribed MEDICATION(S) * Follow a DIABETIC diet: Complex carbs, fiber rich foods, (monounsaturated and polyunsaturated) fats * AVOID saturated/trans fats, high fat foods, >2,300 mg salt per day. * EXERCISE at least 5 times a week for 30 minutes or preferably daily.  * DO NOT SMOKE OR DRINK more than 1 drink a day. * Check your FEET every day. Do not wear tightfitting shoes. Contact us if you develop an ulcer * See your EYE doctor once a year or more if needed * Get a FLU shot once a year * Get a PNEUMONIA vaccine once before and once after age 63 years  GOALS:  * Your Hemoglobin A1c of <7%  * fasting sugars need to be <130 * after meals sugars need to be <180 (2h after you start eating) * Your Systolic BP should be [id number] or lower  * Your Diastolic BP should be 80 or lower  * Your HDL (Good Cholesterol) should be 40 or higher  * Your LDL (Bad Cholesterol) should be 100 or lower. * Your Triglycerides should be 150 or  lower  * Your Urine microalbumin (kidney function) should be <30 * Your Body Mass Index should be 25 or lower    Please consider the following ways to cut down carbs and fat and increase fiber and micronutrients in your diet: - substitute whole grain for white bread or pasta - substitute brown rice for white rice - substitute 90-calorie flat bread pieces for slices of bread when possible - substitute sweet potatoes or yams for white potatoes - substitute humus for  margarine - substitute tofu for cheese when possible - substitute almond or rice milk for regular milk (would not drink soy milk daily due to concern for soy estrogen influence on breast cancer risk) - substitute dark chocolate for other sweets when possible - substitute water - can add lemon or orange slices for taste - for diet sodas (artificial sweeteners will trick your body that you can eat sweets without getting calories and will lead you to overeating and weight gain in the long run) - do not skip breakfast or other meals (this will slow down the metabolism and will result in more weight gain over time)  - can try smoothies made from fruit and almond/rice milk in am instead of regular breakfast - can also try old-fashioned (not instant) oatmeal made with almond/rice milk in am - order the dressing on the side when eating salad at a restaurant (pour less than half of the dressing on the salad) - eat as little meat as possible - can try juicing, but should not forget that juicing will get rid of the fiber, so would alternate with eating raw veg./fruits or drinking smoothies - use as little oil as possible, even when using olive oil - can dress a salad with a mix of balsamic vinegar and lemon juice, for e.g. - use agave nectar, stevia sugar, or regular sugar rather than artificial sweateners - steam or broil/roast veggies  - snack on veggies/fruit/nuts (unsalted, preferably) when possible, rather than processed foods - reduce or eliminate aspartame in diet (it is in diet sodas, chewing gum, etc) Read the labels!  Try to read Dr. Janene Harvey book: "Program for Reversing Diabetes" for other ideas for healthy eating.

## 2015-11-20 NOTE — Progress Notes (Signed)
Pre visit review using our clinic review tool, if applicable. No additional management support is needed unless otherwise documented below in the visit note. 

## 2015-11-20 NOTE — Progress Notes (Signed)
Patient ID: Joe Todd, male   DOB: 26-May-1956, 60 y.o.   MRN: [id number]  HPI: Joe Todd is a 60 y.o.-year-old male, self-referred for management of high blood sugars checked at home - he was not formally dx'ed with diabetes. His wife, Joe Todd, is also my patient.  Patient decided to come to be evaluated for diabetes after he accompanied his wife, who has DM1, to an appointment with me. He checked his sugars at home with his wife's meter and obtained 2 high readings (171, 319). He does not have a PCP and is not on any medicines.   He did not have any HbA1c levels checked before. He does not regularly check sugars at home.  He gets steroid inj's in wrists.  He will retire at the end of 01/2016. He will have had surgery at the end of 11/2015.  Pt's meals are: - Breakfast: nothing or sugar free energy drink - Lunch: [employer], pack of nabs - Dinner: sandwich + fries; pizza - Snacks: 2-3x at night  - Todd CKD, last BUN/creatinine:  Lab Results  Component Value Date   BUN 20 12/17/2012   CREATININE 0.92 12/17/2012   - Todd available lipid panels to review - Todd dilated eye exams, + cataracts - Todd numbness and tingling in his feet.  Pt has FH of DM2 in father.  ROS: Constitutional: + weight gain, Todd fatigue, Todd subjective hyperthermia/hypothermia, + see HPI Eyes: Todd blurry vision, Todd xerophthalmia ENT: Todd sore throat, Todd nodules palpated in throat, Todd dysphagia/odynophagia, Todd hoarseness Cardiovascular: Todd CP/SOB/palpitations/leg swelling Respiratory: Todd cough/SOB Gastrointestinal: Todd N/V/D/C Musculoskeletal: Todd muscle/joint aches Skin: Todd rashes Neurological: Todd tremors/numbness/tingling/dizziness Psychiatric: Todd depression/anxiety + low libido  Todd past medical history on file. Other than the surgeries below.  Past Surgical History  Procedure Laterality Date  . Hernia repair    . 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 Bilateral shoulder surgery     Social History   Social  History  . Marital Status: Married    Spouse Name: N/A  . 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 Smoking status: Never Smoker   . Smokeless tobacco: Never Used  . Alcohol Use: Yes     Comment: every weekend - 4 drinks  . Drug Use: Todd   Current Outpatient Prescriptions on File Prior to Visit  Medication Sig Dispense Refill  . ibuprofen (ADVIL,MOTRIN) 200 MG tablet Take 800 mg by mouth 2 (two) times daily. Pain     Todd current facility-administered medications on file prior to visit.   Allergies  Allergen Reactions  . Penicillins Other (See Comments)    Not remember   Family History  Problem Relation Age of Onset  . Cancer Mother   . Hyperlipidemia Father   . Hypertension Father   . Heart disease Father   . Diabetes Father    PE: BP 136/70 mmHg  Pulse 76  Temp(Src) 98 F (36.7 C) (Oral)  Ht 5' 10.25" (1.784 m)  Wt 281 lb (127.461 kg)  BMI 40.05 kg/m2  SpO2 97% Wt Readings from Last 3 Encounters:  11/20/15 281 lb (127.461 kg)  11/08/15 285 lb (129.275 kg)  11/01/15 285 lb 3.2 oz (129.366 kg)   Constitutional: overweight, in NAD Eyes: PERRLA, EOMI, Todd exophthalmos ENT: moist mucous membranes, Todd thyromegaly, Todd cervical lymphadenopathy Cardiovascular: RRR, Todd MRG Respiratory: CTA B Gastrointestinal: abdomen soft, NT, ND, BS+ Musculoskeletal: Todd deformities, strength intact  in all 4 Skin: moist, warm, Todd rashes Neurological: + tremor with outstretched hands, DTR normal in all 4  ASSESSMENT: 1. New dx of diabetes  PLAN:  1. Patient with recently discovered hyperglycemia, by checking her CBG with wife's glucometer. He had 2 elevated CBGs, but does have increased thirst and urination, and his poc HbA1c tested today is 7.8%. We will also check this in venous blood, along with a CMP. - We discussed about his new dx of Diabetes, discussed about immediate complications of diabetes: Hypo-and hyperglycemia, but also  long-term complications: Retinopathy, neuropathy, nephropathy, coronary artery disease, fatty liver, etc. - We also discussed about targets for hemoglobin A1c between 6 and 7%, since this will greatly reduce the occurrence of the above complications. - As of now, he does complain of increased thirst and urination, but he does not appear to have other diabetic complications. - We gave him a glucometer (One Touch Verio Flex) and advised him to start checking his sugars once a day, rotating check times. He was given the sugar log which he needs to bring back at next visit. - He is aware about diabetes as a disease since his wife, who is also a patient of mine has type 1 diabetes. His father also had diabetes.  - we discussed about improving his diet, which, as of now, is very poor. I also referred him to nutrition. - For now, will start metformin, which I explained that is first-line medication in diabetes. I explained the mechanism of action and possible side effects. I will check his kidney function today, and I may need to advise him to decrease the dose of metformin if his GFR is decreased. - I suggested to:  Patient Instructions  Please start Metformin 500 mg with dinner x 4 days. If you tolerate this well, add another Metformin tablet (500 mg) with breakfast x 4 days. If you tolerate this well, add another metformin tablet with dinner (total 1000 mg) x 4 days. If you tolerate this well, add another metformin tablet with breakfast (total 1000 mg). Continue with 1000 mg of metformin 2x a day with breakfast and dinner.  Check sugars 1x a day, rotating check times.  Please let me know if the sugars are consistently <80 or >200.  Please schedule an appt with Antonieta Iba with nutrition.  Please return in 1.5 months with your sugar log.   - given sugar log and advised how to fill it and to bring it at next appt  - given foot care handout and explained the principles  - given instructions for  hypoglycemia management "15-15 rule"  - advised for yearly eye exams >> he needs a dilated one - Strongly advised him to establish care with a PCP - Return to clinic in 1.5 mo with sugar log   - time spent with the patient: 1 hour, of which >50% was spent in obtaining information about his symptoms, establishing a diabetes diagnosis, counseling him about his condition (please see the discussed topics above), and developing a plan to further investigate and treat it; he had a number of questions which I addressed.  Office Visit on 11/20/2015  Component Date Value Ref Range Status  . Hemoglobin A1C 11/20/2015 7.8   Final  . Sodium 11/20/2015 139  135 - 146 mmol/L Final  . Potassium 11/20/2015 4.5  3.5 - 5.3 mmol/L Final  . Chloride 11/20/2015 103  98 - 110 mmol/L Final  . CO2 11/20/2015 28  20 - 31 mmol/L  Final  . Glucose, Bld 11/20/2015 215* 65 - 99 mg/dL Final  . BUN 11/20/2015 16  7 - 25 mg/dL Final  . Creat 11/20/2015 0.99  0.70 - 1.25 mg/dL Final  . Total Bilirubin 11/20/2015 1.0  0.2 - 1.2 mg/dL Final  . Alkaline Phosphatase 11/20/2015 87  40 - 115 U/L Final  . AST 11/20/2015 47* 10 - 35 U/L Final  . ALT 11/20/2015 88* 9 - 46 U/L Final  . Total Protein 11/20/2015 5.9* 6.1 - 8.1 g/dL Final  . Albumin 11/20/2015 3.9  3.6 - 5.1 g/dL Final  . Calcium 11/20/2015 8.9  8.6 - 10.3 mg/dL Final  . GFR, Est African American 11/20/2015 >89  >=60 mL/min Final  . GFR, Est Non African American 11/20/2015 82  >=60 mL/min Final   Comment:   The estimated GFR is a calculation valid for adults (>=24 years old) that uses the CKD-EPI algorithm to adjust for age and sex. It is   not to be used for children, pregnant women, hospitalized patients,    patients on dialysis, or with rapidly changing kidney function. According to the NKDEP, eGFR >89 is normal, 60-89 shows mild impairment, 30-59 shows moderate impairment, 15-29 shows severe impairment and <15 is ESRD.     Marland Kitchen Hgb A1c MFr Bld 11/20/2015 8.4*  4.6 - 6.5 % Final   Glycemic Control Guidelines for People with Diabetes:Non Diabetic:  <6%Goal of Therapy: <7%Additional Action Suggested:  >8%    Hemoglobin A1c is 8.4%, diagnostic of diabetes. His blood glucose is also above 200. He also has high liver enzymes. I advised him to not take Tylenol, drink alcohol, or eat a lot of fatty foods and I will recheck them at next visit.

## 2015-12-28 ENCOUNTER — Encounter: Payer: Managed Care, Other (non HMO) | Attending: Internal Medicine | Admitting: Dietician

## 2015-12-28 VITALS — Ht 72.0 in | Wt 278.0 lb

## 2015-12-28 DIAGNOSIS — R739 Hyperglycemia, unspecified: Secondary | ICD-10-CM | POA: Insufficient documentation

## 2015-12-28 DIAGNOSIS — E119 Type 2 diabetes mellitus without complications: Secondary | ICD-10-CM

## 2015-12-28 NOTE — Patient Instructions (Addendum)
Get some form of exercise most of the week.  Aim for 30 minutes.  Increase slowly as tolerated. Consider a pedometer app on your phone. Be mindful of the choices when you are eating out.  Look up the restaurant nutrition on line or CalorieKing.com Aim for 3 Carb Choices per meal (45 grams) +/- 1 either way  Aim for 0-2 Carbs per snack if hungry  Include protein in moderation with your meals and snacks Consider reading food labels for Total Carbohydrate and Fat Grams of foods

## 2015-12-28 NOTE — Progress Notes (Addendum)
  Medical Nutrition Therapy:  Appt start time: 1100 end time:  1230.   Assessment:  Primary concerns today: Patient is here alone.  He has newly diagnosed type 2 diabetes.  Wife has type 1 diabetes and stepdaughter has type 2 diabetes.  His wife has gastroparesis.  Patient states that he gets full more easily on Metformin.  He has begun eating a small breakfast and decreasing some of the carb amounts and avoids french fries most often.   HgbA1C 7.8%8.4% 11/20/15.  He has been checking his blood sugar 2-3 times per day.  Am 125-152 and 2 hours after eating 130.  He has not been checking it after dinner which is his largest and highest fat meal.  He is currently on leave from the [employer] due to wrist surgery.  He lives with his wife.  They eat out most often.  Preferred Learning Style:   No preference indicated   Learning Readiness:   Ready  Change in progress   MEDICATIONS: see list   DIETARY INTAKE: 24-hr recall:  B ( AM): crackers or cinnamon toast with cream cheese OR egg McMuffin from [employer].  Used to skip.  Snk ( AM): none  L ( PM): sandwich Snk ( PM): none D ( PM): out to eat:  Chicken or salmon with sides, hamburger or grilled cheese.  He is trying to avoid french fries Snk ( PM): multiple snacks- cookies, brownie, pretzels, part of a hershey bar, occasional ice cream even when not hungry Beverages: water, flavored water, unsweetened tea, 3 ounces of Vodka twice per week.  Patient states that he has decreased this and does not intend to decrease it further.  Each shot is about 100 calories.  Usual physical activity: plans on joining the [facility] in April.  Estimated energy needs: 1800 calories 200 g carbohydrates 135 g protein 50 g fat  Progress Towards Goal(s):  In progress.   Nutritional Diagnosis:  NB-1.1 Food and nutrition-related knowledge deficit As related to balance of carbohydrate, protein, and fat.  As evidenced by diet hx and patient report.    Intervention:   Nutrition counseling and diabetes education initiated. Discussed Carb Counting by food group as method of portion control, reading food labels, and benefits of increased activity. Also discussed basic physiology of Diabetes, target BG ranges pre and post meals, and A1c.   Get some form of exercise most of the week.  Aim for 30 minutes.  Increase slowly as tolerated. Consider a pedometer app on your phone. Be mindful of the choices when you are eating out.  Look up the restaurant nutrition on line or CalorieKing.com Aim for 3 Carb Choices per meal (45 grams) +/- 1 either way  Aim for 0-2 Carbs per snack if hungry  Include protein in moderation with your meals and snacks Consider reading food labels for Total Carbohydrate and Fat Grams of foods  Teaching Method Utilized:  Visual Auditory Hands on  Handouts given during visit include:  Meal plan card  AIC chart  Label reading  Snack list  My plate  Breakfast ideas  Barriers to learning/adherence to lifestyle change: none  Demonstrated degree of understanding via:  Teach Back   Monitoring/Evaluation:  Dietary intake, exercise, and body weight prn.

## 2016-01-05 ENCOUNTER — Telehealth: Payer: Self-pay | Admitting: *Deleted

## 2016-01-05 NOTE — Telephone Encounter (Signed)
Please review pt's last A1c. There is 2 different readings for the same day. Be advised.

## 2016-01-05 NOTE — Telephone Encounter (Signed)
I know. I am not sure what happened, but the point-of-care HbA1c was different than the venous one.

## 2016-01-06 ENCOUNTER — Other Ambulatory Visit: Payer: Self-pay | Admitting: *Deleted

## 2016-01-26 ENCOUNTER — Encounter: Payer: Self-pay | Admitting: Internal Medicine

## 2016-01-26 ENCOUNTER — Ambulatory Visit (INDEPENDENT_AMBULATORY_CARE_PROVIDER_SITE_OTHER): Payer: Managed Care, Other (non HMO) | Admitting: Internal Medicine

## 2016-01-26 VITALS — BP 122/80 | HR 79 | Temp 98.4°F | Resp 12 | Wt 271.8 lb

## 2016-01-26 DIAGNOSIS — R7401 Elevation of levels of liver transaminase levels: Secondary | ICD-10-CM | POA: Insufficient documentation

## 2016-01-26 DIAGNOSIS — E1165 Type 2 diabetes mellitus with hyperglycemia: Secondary | ICD-10-CM | POA: Diagnosis not present

## 2016-01-26 DIAGNOSIS — R740 Nonspecific elevation of levels of transaminase and lactic acid dehydrogenase [LDH]: Secondary | ICD-10-CM

## 2016-01-26 MED ORDER — METFORMIN HCL 500 MG PO TABS: ORAL_TABLET | ORAL | Status: DC

## 2016-01-26 MED ORDER — GLUCOSE BLOOD VI STRP: ORAL_STRIP | Status: DC

## 2016-01-26 NOTE — Patient Instructions (Signed)
Please return in June for another visit.  Decrease Metformin dose as follows: - 500 mg in am - 500 mg with lunch - 1000 mg with dinner - 500 mg with after dinner snack  Please return in 2 months with your sugar log.

## 2016-01-26 NOTE — Progress Notes (Signed)
Patient ID: Joe Todd, male   DOB: 1956/03/16, 60 y.o.   MRN: [id number]  HPI: Joe Todd is a 60 y.o.-year-old male, returning for follow-up for type 2 diabetes, uncontrolled, not insulin-dependent, without complications. His wife, Joe Todd, is also my patient.   Patient decided to come to be evaluated for diabetes 2 months ago after he accompanied his wife, who has DM1, to an appointment with me. He checked his sugars at home with his wife's meter and obtained 2 high readings (171, 319). He does not have a PCP and is not on any medicines.   We checked a hemoglobin A1c at last visit and this was high at last visit: Lab Results  Component Value Date   HGBA1C 8.4* 11/20/2015   HGBA1C 7.8 11/20/2015   He is on: -Metformin 500 mg in am, 1000 mg with lunch, 1000 mg with dinner, 500 mg with snack -started 11/2015   Sugars: - am: 99, 115-135, 160 - 2h after b'fast: n/c - lunch:  - 2h after lunch: 120 - dinner: n/c  - 2h after dinner: 130s - bedtime: n/c  Pt's meals are: - Breakfast: nothing or sugar free energy drink - Lunch: [employer], pack of nabs - Dinner: sandwich; pizza - Snacks: 2-3x at night He did see nutrition since last visit.  He cut down/out fried food and concentrated sweets.  Lost 10 pounds since last visit!   - no CKD, last BUN/creatinine:  Lab Results  Component Value Date   BUN 16 11/20/2015   CREATININE 0.99 11/20/2015   - no available lipid panels to review - no dilated eye exams, + cataracts - no numbness and tingling in his feet.  He got steroid inj's in wrists before his carpal tunnel sx  He will retire at the end of 01/2016.   ROS: Constitutional: + weight loss, no fatigue, no subjective hyperthermia/hypothermia Eyes: no blurry vision, no xerophthalmia ENT: no sore throat, no nodules palpated in throat, no dysphagia/odynophagia, no hoarseness Cardiovascular: no CP/SOB/palpitations/leg swelling Respiratory: no cough/SOB Gastrointestinal: no  N/V/D/C Musculoskeletal: no muscle/joint aches Skin: no rashes Neurological: no tremors/numbness/tingling/dizziness  No past medical history on file. Other than the surgeries below.  Past Surgical History  Procedure Laterality Date  . Hernia repair    . 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 Bilateral shoulder surgery     Social History   Social History  . Marital Status: Married    Spouse Name: N/A  . 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 Smoking status: Never Smoker   . Smokeless tobacco: Never Used  . Alcohol Use: Yes     Comment: every weekend - 4 drinks  . Drug Use: No   Current Outpatient Prescriptions on File Prior to Visit  Medication Sig Dispense Refill  . glucose blood (ONETOUCH VERIO) test strip Use once a day as advised: One Touch Verio flex 100 each 11  . ibuprofen (ADVIL,MOTRIN) 200 MG tablet Take 800 mg by mouth 2 (two) times daily. Pain    . metFORMIN (GLUCOPHAGE) 500 MG tablet Take 2 tablets (1,000 mg total) by mouth 2 (two) times daily with a meal. 360 tablet 1  . ONE TOUCH LANCETS MISC Use once a day: One Touch Verio flex 100 each 11  . oxycodone-acetaminophen (PERCOCET) 2.5-325 MG tablet Take 1 tablet by mouth every 4 (four) hours as needed for pain.     No current facility-administered medications on file  prior to visit.   Allergies  Allergen Reactions  . Penicillins Other (See Comments)    Not remember   Family History  Problem Relation Age of Onset  . Cancer Mother   . Hyperlipidemia Father   . Hypertension Father   . Heart disease Father   . Diabetes Father    PE: BP 122/80 mmHg  Pulse 79  Temp(Src) 98.4 F (36.9 C) (Oral)  Resp 12  Wt 271 lb 12.8 oz (123.288 kg)  SpO2 96% Body mass index is 36.85 kg/(m^2). Wt Readings from Last 3 Encounters:  01/26/16 271 lb 12.8 oz (123.288 kg)  12/28/15 278 lb (126.1 kg)  11/20/15 281 lb (127.461 kg)   Constitutional: overweight, in  NAD Eyes: PERRLA, EOMI, no exophthalmos ENT: moist mucous membranes, no thyromegaly, no cervical lymphadenopathy Cardiovascular: RRR, No MRG Respiratory: CTA B Gastrointestinal: abdomen soft, NT, ND, BS+ Musculoskeletal: no deformities, strength intact in all 4 Skin: moist, warm, no rashes Neurological: no tremor with outstretched hands, DTR normal in all 4  ASSESSMENT: 1. DM2, non-insulin-dependent, w/o complications  2. Transaminitis  PLAN:  1. Patient with recently dx'ed DM2. He had 2 elevated CBGs at home + increased thirst and urination+ at last visit HbA1c returned high, at 8.4%, confirming DM. - we discussed about improving his diet. I also referred him to nutrition. He did have this appt. He is doing a great job being mindful about his eating, reducing fried foods and sweets and he lost 10 lbs in last 2 mo!!! - We started metformin at last visit >> tolerating it well, but he is using 6 tabs a day, more than the Rx'ed 4x a day >> will decrease to 5 tabs a day - I suggested to:  Patient Instructions  Please return in June for another visit.  Decrease Metformin dose as follows: - 500 mg in am - 500 mg with lunch - 1000 mg with dinner - 500 mg with after dinner snack  Please return in 2 months with your sugar log.   - advised for yearly eye exams >> he needs a dilated one - Return to clinic in 2 mo with sugar log   2. Transaminitis - at last visit, he was also found to have high liver enzymes. I advised him to not take Tylenol, drink alcohol, or eat a lot of fatty foods. - this is likely improving with lower fat and carb diet, with weight loss, and with improved DM control - will recheck LFTs and check Lipids at next visit - needs to establish care with a PCP

## 2016-03-23 ENCOUNTER — Ambulatory Visit: Payer: Managed Care, Other (non HMO) | Admitting: Internal Medicine

## 2016-04-22 ENCOUNTER — Other Ambulatory Visit: Payer: Self-pay

## 2016-04-22 ENCOUNTER — Telehealth: Payer: Self-pay | Admitting: Internal Medicine

## 2016-04-22 MED ORDER — GLUCOSE BLOOD VI STRP: ORAL_STRIP | Status: DC

## 2016-04-22 NOTE — Telephone Encounter (Signed)
Patient need refill of glucose blood (ONETOUCH VERIO) test strip, Dr Cruzita Lederer state she would give patient 2 bottles per patient. Send to  [id number] - [place], [place]. AT [address] [phone] (Phone) [phone] (Fax)

## 2016-05-11 ENCOUNTER — Other Ambulatory Visit: Payer: Self-pay | Admitting: Internal Medicine

## 2016-05-12 ENCOUNTER — Other Ambulatory Visit: Payer: Self-pay

## 2016-05-12 ENCOUNTER — Telehealth: Payer: Self-pay | Admitting: Internal Medicine

## 2016-05-12 NOTE — Telephone Encounter (Signed)
Metformin was sent in already to pharmacy. Yesterday 05/11/16

## 2016-05-12 NOTE — Telephone Encounter (Signed)
Patient need a refill of metFORMIN (GLUCOPHAGE) 500 MG tablet. [id number] - [place], [place] [address] AT [address] [phone] (Phone) [phone] (Fax)   90 day supply

## 2016-05-17 ENCOUNTER — Ambulatory Visit: Payer: Managed Care, Other (non HMO) | Admitting: Internal Medicine

## 2016-07-29 LAB — HM DIABETES EYE EXAM

## 2016-11-05 ENCOUNTER — Other Ambulatory Visit: Payer: Self-pay | Admitting: Internal Medicine

## 2016-11-08 ENCOUNTER — Other Ambulatory Visit: Payer: Self-pay

## 2016-11-08 ENCOUNTER — Telehealth: Payer: Self-pay | Admitting: Internal Medicine

## 2016-11-08 MED ORDER — GLUCOSE BLOOD VI STRP: ORAL_STRIP | 11 refills | Status: DC

## 2016-11-08 NOTE — Telephone Encounter (Signed)
rx sent

## 2016-11-08 NOTE — Telephone Encounter (Signed)
Pt needs test strips for one touch verio flex meter [facility] [address]

## 2016-11-17 ENCOUNTER — Ambulatory Visit (INDEPENDENT_AMBULATORY_CARE_PROVIDER_SITE_OTHER): Payer: 59 | Admitting: Physician Assistant

## 2016-11-17 VITALS — BP 140/76 | HR 78 | Temp 98.1°F | Ht 72.0 in | Wt 287.6 lb

## 2016-11-17 DIAGNOSIS — Z23 Encounter for immunization: Secondary | ICD-10-CM | POA: Diagnosis not present

## 2016-11-17 DIAGNOSIS — Z114 Encounter for screening for human immunodeficiency virus [HIV]: Secondary | ICD-10-CM | POA: Diagnosis not present

## 2016-11-17 DIAGNOSIS — Z0000 Encounter for general adult medical examination without abnormal findings: Secondary | ICD-10-CM

## 2016-11-17 DIAGNOSIS — Z1159 Encounter for screening for other viral diseases: Secondary | ICD-10-CM | POA: Diagnosis not present

## 2016-11-17 DIAGNOSIS — Z13220 Encounter for screening for lipoid disorders: Secondary | ICD-10-CM | POA: Diagnosis not present

## 2016-11-17 DIAGNOSIS — Z1389 Encounter for screening for other disorder: Secondary | ICD-10-CM | POA: Diagnosis not present

## 2016-11-17 DIAGNOSIS — Z130 Encounter for screening for diseases of the blood and blood-forming organs and certain disorders involving the immune mechanism: Secondary | ICD-10-CM

## 2016-11-17 DIAGNOSIS — Z13228 Encounter for screening for other metabolic disorders: Secondary | ICD-10-CM | POA: Diagnosis not present

## 2016-11-17 NOTE — Patient Instructions (Addendum)
Exercise improves every system in the body.  It lowers the risk of heart disease, decreases blood pressure, reduces the symptoms of depression and anxiety, and lowers blood sugar. To receive these benefits, try to get 150 minutes of planned exercise each week.  You can break this 150 minutes up however you like.  For instance, you can perform 30 minutes of brisk walking 5 days a week, or perform 50 minutes 3 days a week.  If you don't like walking, or can't find a safe place to walk, find another way to move that you can enjoy.  Exercise tapes, cycling, stair climbing, swimming, or a combination will be just as good as a walking program. To ensure the proper intensity, you can use the talk test. Essentially, you should be able to carry on a conversation, but you should have to take short breaks from the conversation in order catch your breath.     IF you received an x-ray today, you will receive an invoice from [facility]. Please contact [facility] at [phone] with questions or concerns regarding your invoice.   IF you received labwork today, you will receive an invoice from [facility]. Please contact [facility] at [phone] with questions or concerns regarding your invoice.   Our billing staff will not be able to assist you with questions regarding bills from these companies.  You will be contacted with the lab results as soon as they are available. The fastest way to get your results is to activate your My Chart account. Instructions are located on the last page of this paperwork. If you have not heard from us regarding the results in 2 weeks, please contact this office.     

## 2016-11-17 NOTE — Progress Notes (Signed)
11/17/2016 10:13 AM   DOB: 1956/01/12 / MRN: [id number]  SUBJECTIVE:  Joe Todd is a 61 y.o. male presenting for annual exam. He has a history of diabetes and is seen and managed by endocrinology. He reports he is a never smoker. He does not exercise. He used to walk however has gotten away from this. He is gaining weight and has not changed his diet since being diagnosed.   There is no immunization history for the selected administration types on file for this patient.  He is allergic to penicillins.   He  has no past medical history on file.    He  reports that he has never smoked. He has never used smokeless tobacco. He reports that he drinks alcohol. He reports that he does not use drugs. 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  His family history includes Cancer in his mother; Diabetes in his father; Heart disease in his father; Hyperlipidemia in his father; Hypertension in his father.  Review of Systems  Constitutional: Negative for chills and fever.  HENT: Negative for sore throat.   Respiratory: Negative for cough.   Skin: Negative for itching and rash.  Neurological: Negative for dizziness.    The problem list and medications were reviewed and updated by myself where necessary and exist elsewhere in the encounter.   OBJECTIVE:  BP 140/76   Pulse 78   Temp 98.1 F (36.7 C) (Oral)   Ht 6' (1.829 m)   Wt 287 lb 9.6 oz (130.5 kg)   SpO2 95%   BMI 39.01 kg/m   Physical Exam  Constitutional: He is oriented to person, place, and time. He appears well-developed. He does not appear ill.  Eyes: Conjunctivae and EOM are normal. Pupils are equal, round, and reactive to light.  Cardiovascular: Normal rate.   Pulmonary/Chest: Effort normal.  Abdominal: He exhibits no distension.  Musculoskeletal: Normal range of motion.  Neurological: He is alert and oriented to person, place,  and time. No cranial nerve deficit. Coordination normal.  Skin: Skin is warm and dry. He is not diaphoretic.  Psychiatric: He has a normal mood and affect.  Nursing note and vitals reviewed.     No results found for this or any previous visit (from the past 72 hour(s)).  No results found.  Wt Readings from Last 3 Encounters:  11/17/16 287 lb 9.6 oz (130.5 kg)  01/26/16 271 lb 12.8 oz (123.3 kg)  12/28/15 278 lb (126.1 kg)     ASSESSMENT AND PLAN:  Tron was seen today for annual exam.  Diagnoses and all orders for this visit:  Annual physical exam: New pt to establish care.  He is a diabetic with poor control.  He has had no maintenance. Will see him back in 1 month for diabetes follow up.  Will likely need statin, ACE/ARB.  Needs foot check.   Screening for lipid disorders -     Lipid panel  Need for hepatitis C screening test -     Hepatitis C antibody  Screening for HIV (human immunodeficiency virus) -     HIV antibody  Screening for deficiency anemia -     CBC  Screening for nephropathy -     Basic metabolic panel  Screening for metabolic disorder -     Hemoglobin A1c  Need for diphtheria-tetanus-pertussis (Tdap) vaccine -     Tdap vaccine greater than or equal to 7yo  IM  Need for prophylactic vaccination against Streptococcus pneumoniae (pneumococcus) -     Pneumococcal polysaccharide vaccine 23-valent greater than or equal to 2yo subcutaneous/IM  Need for prophylactic vaccination and inoculation against influenza -     Flu Vaccine QUAD 36+ mos IM    The patient is advised to call or return to clinic if he does not see an improvement in symptoms, or to seek the care of the closest emergency department if he worsens with the above plan.   Philis Fendt, MHS, PA-C [facility] 11/17/2016 10:13 AM

## 2016-11-18 LAB — LIPID PANEL
CHOLESTEROL TOTAL: 218 mg/dL — AB (ref 100–199)
Chol/HDL Ratio: 4.6 ratio units (ref 0.0–5.0)
HDL: 47 mg/dL (ref >39)
LDL Calculated: 141 mg/dL — ABNORMAL HIGH (ref 0–99)
Triglycerides: 148 mg/dL (ref 0–149)
VLDL CHOLESTEROL CAL: 30 mg/dL (ref 5–40)

## 2016-11-18 LAB — HEMOGLOBIN A1C
Est. average glucose Bld gHb Est-mCnc: 163 mg/dL
Hgb A1c MFr Bld: 7.3 % — ABNORMAL HIGH (ref 4.8–5.6)

## 2016-11-18 LAB — CBC
HEMATOCRIT: 43.1 % (ref 37.5–51.0)
HEMOGLOBIN: 14.1 g/dL (ref 13.0–17.7)
MCH: 30.9 pg (ref 26.6–33.0)
MCHC: 32.7 g/dL (ref 31.5–35.7)
MCV: 94 fL (ref 79–97)
Platelets: 217 10*3/uL (ref 150–379)
RBC: 4.57 x10E6/uL (ref 4.14–5.80)
RDW: 13.8 % (ref 12.3–15.4)
WBC: 6.1 10*3/uL (ref 3.4–10.8)

## 2016-11-18 LAB — BASIC METABOLIC PANEL
BUN/Creatinine Ratio: 16 (ref 10–24)
BUN: 13 mg/dL (ref 8–27)
CALCIUM: 9.6 mg/dL (ref 8.6–10.2)
CHLORIDE: 101 mmol/L (ref 96–106)
CO2: 22 mmol/L (ref 18–29)
CREATININE: 0.81 mg/dL (ref 0.76–1.27)
GFR calc non Af Amer: 95 mL/min/{1.73_m2} (ref >59)
GFR, EST AFRICAN AMERICAN: 110 mL/min/{1.73_m2} (ref >59)
GLUCOSE: 146 mg/dL — AB (ref 65–99)
Potassium: 4.4 mmol/L (ref 3.5–5.2)
Sodium: 138 mmol/L (ref 134–144)

## 2016-11-18 LAB — HIV ANTIBODY (ROUTINE TESTING W REFLEX): HIV Screen 4th Generation wRfx: NONREACTIVE

## 2016-11-18 LAB — HEPATITIS C ANTIBODY: HEP C VIRUS AB: 0.1 {s_co_ratio} (ref 0.0–0.9)

## 2016-11-29 ENCOUNTER — Encounter: Payer: Self-pay | Admitting: Emergency Medicine

## 2016-11-29 NOTE — Progress Notes (Signed)
This labs look relatively good.  He is coming back in a month and we can discuss this in further detail at that time. Letter is okay. Philis Fendt, MS, PA-C 3:54 PM, 11/29/2016

## 2016-11-30 NOTE — Progress Notes (Signed)
Letter mailed

## 2016-12-22 ENCOUNTER — Ambulatory Visit (INDEPENDENT_AMBULATORY_CARE_PROVIDER_SITE_OTHER): Payer: 59 | Admitting: Physician Assistant

## 2016-12-22 VITALS — BP 142/80 | HR 89 | Temp 98.7°F | Resp 18 | Ht 72.0 in | Wt 276.6 lb

## 2016-12-22 DIAGNOSIS — E1165 Type 2 diabetes mellitus with hyperglycemia: Secondary | ICD-10-CM | POA: Diagnosis not present

## 2016-12-22 DIAGNOSIS — Z9189 Other specified personal risk factors, not elsewhere classified: Secondary | ICD-10-CM | POA: Diagnosis not present

## 2016-12-22 DIAGNOSIS — Z7689 Persons encountering health services in other specified circumstances: Secondary | ICD-10-CM | POA: Diagnosis not present

## 2016-12-22 MED ORDER — ATORVASTATIN CALCIUM 20 MG PO TABS: 20.0000 mg | ORAL_TABLET | Freq: Every day | ORAL | 3 refills | Status: DC

## 2016-12-22 NOTE — Patient Instructions (Signed)
     IF you received an x-ray today, you will receive an invoice from [facility]. Please contact [facility] at [phone] with questions or concerns regarding your invoice.   IF you received labwork today, you will receive an invoice from [facility]. Please contact [facility] at [phone] with questions or concerns regarding your invoice.   Our billing staff will not be able to assist you with questions regarding bills from these companies.  You will be contacted with the lab results as soon as they are available. The fastest way to get your results is to activate your My Chart account. Instructions are located on the last page of this paperwork. If you have not heard from us regarding the results in 2 weeks, please contact this office.     

## 2016-12-22 NOTE — Progress Notes (Signed)
12/22/2016 9:23 AM   DOB: 1955/11/06 / MRN: [id number]  SUBJECTIVE:  Joe Todd is a 61 y.o. male presenting for diabetes recheck.  Last I saw him we had a long discussion about the need for lifestyle change.  We agreed that we would give him about one month to attempt weight loss and exercise before pursuing the diabetic standard of care.  He comes back today for a recheck and CHL shows he has lost 11 lbs.  He tells me he has been walking 30-40 minutes daily 5 days a week.  He is reducing his carbohydrate intake.  Immunization History  Administered Date(s) Administered  . Influenza,inj,Quad PF,36+ Mos 11/17/2016  . Pneumococcal Polysaccharide-23 11/17/2016  . Tdap 11/17/2016   He is allergic to penicillins.   He  has no past medical history on file.    He  reports that he has never smoked. He has never used smokeless tobacco. He reports that he drinks alcohol. He reports that he does not use drugs. He  has no sexual activity history on file. 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  His family history includes Cancer in his mother; Diabetes in his father; Heart disease in his father; Hyperlipidemia in his father; Hypertension in his father.  Review of Systems  Constitutional: Negative for chills and fever.  HENT: Negative for sore throat.   Respiratory: Negative for cough.   Skin: Negative for itching and rash.  Neurological: Negative for dizziness.    The problem list and medications were reviewed and updated by myself where necessary and exist elsewhere in the encounter.   OBJECTIVE:  BP (!) 142/80 (BP Location: Right Arm, Patient Position: Sitting, Cuff Size: Large)   Pulse 89   Temp 98.7 F (37.1 C) (Oral)   Resp 18   Ht 6' (1.829 m)   Wt 276 lb 9.6 oz (125.5 kg)   SpO2 97%   BMI 37.51 kg/m   Physical Exam  Constitutional: He is oriented to person, place, and time. He  appears well-developed. He does not appear ill.  Eyes: Conjunctivae and EOM are normal. Pupils are equal, round, and reactive to light.  Cardiovascular: Normal rate.   Pulmonary/Chest: Effort normal.  Abdominal: He exhibits no distension.  Musculoskeletal: Normal range of motion.  Neurological: He is alert and oriented to person, place, and time. No cranial nerve deficit. Coordination normal.  Skin: Skin is warm and dry. He is not diaphoretic.  Psychiatric: He has a normal mood and affect.  Nursing note and vitals reviewed.   Wt Readings from Last 3 Encounters:  12/22/16 276 lb 9.6 oz (125.5 kg)  11/17/16 287 lb 9.6 oz (130.5 kg)  01/26/16 271 lb 12.8 oz (123.3 kg)   Lab Results  Component Value Date   CHOL 218 (H) 11/17/2016   HDL 47 11/17/2016   LDLCALC 141 (H) 11/17/2016   TRIG 148 11/17/2016   CHOLHDL 4.6 11/17/2016   Lab Results  Component Value Date   HGBA1C 7.3 (H) 11/17/2016   Lab Results  Component Value Date   CREATININE 0.81 11/17/2016          No results found for this or any previous visit (from the past 72 hour(s)).  No results found.  ASSESSMENT AND PLAN:  Joe Todd was seen today for follow-up.  Diagnoses and all orders for this visit:  Type 2 diabetes mellitus with hyperglycemia, without long-term current use of insulin (  [facility]): See his logs. Daily fasting showing excellent glucose control. Average ambulatory blood pressure at goal and no sign of kidney disease thus far.   -     Cancel: Hemoglobin A1c -     Microalbumin, urine -     HM Diabetes Foot Exam  Immunizations reviewed and up to date  At risk for acute ischemic cardiac event: ASCVD at 17%.  He has agreed to start a low dose statin.  He has a history of some moderately increased liver enzymes so will keep the dose low and recheck in about 3 months.   -     atorvastatin (LIPITOR) 20 MG tablet; Take 1 tablet (20 mg total) by mouth daily. Take 1 half tab daily for the first week, then take the  full tab.    The patient is advised to call or return to clinic if he does not see an improvement in symptoms, or to seek the care of the closest emergency department if he worsens with the above plan.   Philis Fendt, MHS, PA-C [facility] 12/22/2016 9:23 AM

## 2017-01-02 ENCOUNTER — Other Ambulatory Visit: Payer: Self-pay | Admitting: Internal Medicine

## 2017-01-23 ENCOUNTER — Encounter: Payer: Self-pay | Admitting: Physician Assistant

## 2017-01-23 ENCOUNTER — Telehealth: Payer: Self-pay | Admitting: Family Medicine

## 2017-01-23 ENCOUNTER — Ambulatory Visit (INDEPENDENT_AMBULATORY_CARE_PROVIDER_SITE_OTHER): Payer: 59 | Admitting: Physician Assistant

## 2017-01-23 VITALS — BP 124/74 | HR 77 | Temp 98.1°F | Resp 16 | Ht 72.0 in | Wt 268.0 lb

## 2017-01-23 DIAGNOSIS — Z791 Long term (current) use of non-steroidal anti-inflammatories (NSAID): Secondary | ICD-10-CM | POA: Diagnosis not present

## 2017-01-23 DIAGNOSIS — E119 Type 2 diabetes mellitus without complications: Secondary | ICD-10-CM

## 2017-01-23 HISTORY — DX: Long term (current) use of non-steroidal anti-inflammatories (nsaid): Z79.1

## 2017-01-23 NOTE — Telephone Encounter (Signed)
Pt calling stating that Joe Todd was suppose to send him a link about DM to his phone please respond

## 2017-01-23 NOTE — Patient Instructions (Addendum)
  Take 100mg  of tylenol every 8 hours as needed for pain. Try to avoid Ibuprofen and Aleve when possible.   IF you received an x-ray today, you will receive an invoice from [facility]. Please contact [facility] at [phone] with questions or concerns regarding your invoice.   IF you received labwork today, you will receive an invoice from [facility]. Please contact [facility] at [phone] with questions or concerns regarding your invoice.   Our billing staff will not be able to assist you with questions regarding bills from these companies.  You will be contacted with the lab results as soon as they are available. The fastest way to get your results is to activate your My Chart account. Instructions are located on the last page of this paperwork. If you have not heard from [place] regarding the results in 2 weeks, please contact this office.

## 2017-01-23 NOTE — Progress Notes (Signed)
01/23/2017 3:20 PM   DOB: Jan 25, 1956 / MRN: [id number]  SUBJECTIVE:  Joe Todd is a 61 y.o. male presenting for diabetes recheck. He has lost another 11 lbs. Tells me his sugars are running around ~120 but has seen as low as 99.   He is taking 4 metformin daily now. He is walking more than 30 minutes daily 5 days. He continues to try and eat healthy.  No candy, no cookies, and tries to eat a lot of vegetables and fresh fruit.  He likes whipped yogurt and this is 140 calories.  He trys to eat at 1200 calories daily and not more. Eating lots of peanuts.    He is taking his Lipitor 20 mg daily and is having no problems.   He is taking Ibuprofen 800 mg in the morning and two aleve pm at night.   Immunization History  Administered Date(s) Administered  . Influenza,inj,Quad PF,36+ Mos 11/17/2016  . Pneumococcal Polysaccharide-23 11/17/2016  . Tdap 11/17/2016     He is allergic to penicillins.   He  has a past medical history of NSAID long-term use (01/23/2017).    He  reports that he has never smoked. He has never used smokeless tobacco. He reports that he drinks alcohol. He reports that he does not use drugs. He  has no sexual activity history on file. 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  His family history includes Cancer in his mother; Diabetes in his father; Heart disease in his father; Hyperlipidemia in his father; Hypertension in his father.  Review of Systems  Constitutional: Negative for chills, diaphoresis and fever.  Eyes: Negative.   Respiratory: Negative for cough, hemoptysis, sputum production, shortness of breath and wheezing.   Cardiovascular: Negative for chest pain, orthopnea and leg swelling.  Gastrointestinal: Negative for nausea.  Skin: Negative for rash.  Neurological: Negative for dizziness, sensory change, speech change, focal weakness and headaches.    The  problem list and medications were reviewed and updated by myself where necessary and exist elsewhere in the encounter.   OBJECTIVE:  BP 124/74   Pulse 77   Temp 98.1 F (36.7 C) (Oral)   Resp 16   Ht 6' (1.829 m)   Wt 268 lb (121.6 kg)   SpO2 96%   BMI 36.35 kg/m   Physical Exam  Constitutional: He appears well-developed. He is active and cooperative.  Non-toxic appearance.  Cardiovascular: Normal rate, regular rhythm, S1 normal, S2 normal, normal heart sounds, intact distal pulses and normal pulses.  Exam reveals no gallop and no friction rub.   No murmur heard. Pulmonary/Chest: Effort normal. No stridor. No tachypnea. No respiratory distress. He has no wheezes. He has no rales.  Abdominal: He exhibits no distension.  Musculoskeletal: He exhibits no edema.  Neurological: He is alert.  Skin: Skin is warm and dry. He is not diaphoretic. No pallor.  Vitals reviewed.   Lab Results  Component Value Date   WBC 6.1 11/17/2016   HGB 15.3 12/17/2012   HCT 43.1 11/17/2016   MCV 94 11/17/2016   PLT 217 11/17/2016    Lab Results  Component Value Date   NA 138 11/17/2016   K 4.4 11/17/2016   CL 101 11/17/2016   CO2 22 11/17/2016    Lab Results  Component Value Date   CREATININE 0.81 11/17/2016    Lab Results  Component Value Date   ALT 88 (H)  11/20/2015   AST 47 (H) 11/20/2015   ALKPHOS 87 11/20/2015   BILITOT 1.0 11/20/2015    Lab Results  Component Value Date   HGBA1C 7.3 (H) 11/17/2016    Wt Readings from Last 3 Encounters:  01/23/17 268 lb (121.6 kg)  12/22/16 276 lb 9.6 oz (125.5 kg)  11/17/16 287 lb 9.6 oz (130.5 kg)     ASSESSMENT AND PLAN:  Joe Todd was seen today for follow-up.  Diagnoses and all orders for this visit:  Type II diabetes mellitus, well controlled ([place]): Fast[place] are doing well. He will come back in about 30 days for a recheck of A1c and I will expect him to be doing much better with weight loss.   NSAID long-term use: Advising he  move to tylenol.     The patient is advised to call or return to clinic if he does not see an improvement in symptoms, or to seek the care of the closest emergency department if he worsens with the above plan.   Philis Fendt, MHS, PA-C [facility] 01/23/2017 3:20 PM

## 2017-01-25 NOTE — Telephone Encounter (Signed)
I have sent this to his phone via text twice. Philis Fendt, MS, PA-C 11:55 AM, 01/25/2017

## 2017-01-27 ENCOUNTER — Other Ambulatory Visit: Payer: Self-pay | Admitting: Physician Assistant

## 2017-01-27 NOTE — Telephone Encounter (Signed)
Advised that his average glucose per his ambulatory documentation is likely going to be in the fives. He will come back in one month for a recheck A1c.    Philis Fendt, MS, PA-C 3:43 PM, 01/27/2017

## 2017-02-23 ENCOUNTER — Encounter: Payer: Self-pay | Admitting: Physician Assistant

## 2017-02-23 ENCOUNTER — Ambulatory Visit (INDEPENDENT_AMBULATORY_CARE_PROVIDER_SITE_OTHER): Payer: 59 | Admitting: Physician Assistant

## 2017-02-23 VITALS — BP 131/77 | HR 78 | Temp 99.0°F | Resp 18 | Ht 72.0 in | Wt 263.2 lb

## 2017-02-23 DIAGNOSIS — E1165 Type 2 diabetes mellitus with hyperglycemia: Secondary | ICD-10-CM

## 2017-02-23 LAB — POCT GLYCOSYLATED HEMOGLOBIN (HGB A1C): Hemoglobin A1C: 5.9

## 2017-02-23 MED ORDER — GLUCOSE BLOOD VI STRP: ORAL_STRIP | 3 refills | Status: DC

## 2017-02-23 MED ORDER — METFORMIN HCL 500 MG PO TABS: 500.0000 mg | ORAL_TABLET | Freq: Three times a day (TID) | ORAL | 1 refills | Status: DC

## 2017-02-23 NOTE — Patient Instructions (Signed)
     IF you received an x-ray today, you will receive an invoice from [facility]. Please contact [facility] at [phone] with questions or concerns regarding your invoice.   IF you received labwork today, you will receive an invoice from [facility]. Please contact [facility] at [phone] with questions or concerns regarding your invoice.   Our billing staff will not be able to assist you with questions regarding bills from these companies.  You will be contacted with the lab results as soon as they are available. The fastest way to get your results is to activate your My Chart account. Instructions are located on the last page of this paperwork. If you have not heard from us regarding the results in 2 weeks, please contact this office.     

## 2017-02-23 NOTE — Progress Notes (Signed)
02/23/2017 2:13 PM   DOB: [birth year] / MRN: [id number]  SUBJECTIVE:  Joe Todd is a 61 y.o. male presenting for recheck of diabetes. He is very self motivated and has lost 13 lbs in the last 2 months.  He is checking his sugar regularly and last I calculated, per his fasting sugars, his average glucose would equal an a1c in the high fives.  Immunizations are current thus.  BP is historically controlled.He has continued to take 800 mg Ibuprofen in the morning and then 400 mg at night. He is walking an hour daily 5 days a week and plans to continue this.  He wants to add in stair climbing, walking and weight.   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  He wonders if he can see someone for his wrist and back for potential injections.   Immunization History  Administered Date(s) Administered  . Influenza,inj,Quad PF,36+ Mos 11/17/2016  . Pneumococcal Polysaccharide-23 11/17/2016  . Tdap 11/17/2016     He is allergic to penicillins.   He  has a past medical history of NSAID long-term use (01/23/2017).    He  reports that he has never smoked. He has never used smokeless tobacco. He reports that he drinks about 4.2 oz of alcohol per week . He reports that he does not use drugs. He  has no sexual activity history on file. 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  His family history includes Cancer in his mother; Diabetes in his father; Heart disease in his father; Hyperlipidemia in his father; Hypertension in his father.  Review of Systems  Constitutional: Negative for chills, diaphoresis and fever.  Eyes: Negative.   Respiratory: Negative for cough, hemoptysis, sputum production, shortness of breath and wheezing.   Cardiovascular: Negative for chest pain, orthopnea and leg swelling.  Gastrointestinal: Negative for abdominal pain and nausea.  Genitourinary:  Negative for dysuria, flank pain, frequency, hematuria and urgency.  Skin: Negative for rash.  Neurological: Negative for dizziness, sensory change, speech change, focal weakness and headaches.    The problem list and medications were reviewed and updated by myself where necessary and exist elsewhere in the encounter.   OBJECTIVE:  BP 131/77 (BP Location: Right Arm, Patient Position: Sitting, Cuff Size: Large)   Pulse 78   Temp 99 F (37.2 C) (Oral)   Resp 18   Ht 6' (1.829 m)   Wt 263 lb 3.2 oz (119.4 kg)   SpO2 96%   BMI 35.70 kg/m   Wt Readings from Last 3 Encounters:  02/23/17 263 lb 3.2 oz (119.4 kg)  01/23/17 268 lb (121.6 kg)  12/22/16 276 lb 9.6 oz (125.5 kg)   BP Readings from Last 3 Encounters:  02/23/17 131/77  01/23/17 124/74  12/22/16 (!) 142/80   Lab Results  Component Value Date   ALT 88 (H) 11/20/2015   AST 47 (H) 11/20/2015   ALKPHOS 87 11/20/2015   BILITOT 1.0 11/20/2015   Physical Exam  Constitutional: He appears well-developed. He is active and cooperative.  Non-toxic appearance.  Cardiovascular: Normal rate, regular rhythm, S1 normal, S2 normal, normal heart sounds, intact distal pulses and normal pulses.  Exam reveals no gallop and no friction rub.   No murmur heard. Pulmonary/Chest: Effort normal. No stridor. No tachypnea. No respiratory distress. He has no wheezes. He has no rales.  Abdominal: He exhibits no distension.  Musculoskeletal: He exhibits no edema.  Neurological: He is alert.  Skin: Skin is warm and dry. He is not diaphoretic. No pallor.  Vitals reviewed.  Results for orders placed or performed in visit on 02/23/17 (from the past 72 hour(s))  POCT glycosylated hemoglobin (Hb A1C)     Status: None   Collection Time: 02/23/17  2:01 PM  Result Value Ref Range   Hemoglobin A1C 5.9    No results found.  ASSESSMENT AND PLAN:  [name] was seen today for follow-up.  Diagnoses and all orders for this visit:  Type 2 diabetes  mellitus with hyperglycemia, without long-term current use of insulin [name]): He wants to go to for a colonoscopy later this year. Moving metformin to 1500 mg daily.  Advised he continue lifestyle management.  I am proud of his progress.  -     POCT glycosylated hemoglobin (Hb A1C) -     CMP14+EGFR -     Lipid panel    The patient is advised to call or return to clinic if he does not see an improvement in symptoms, or to seek the care of the closest emergency department if he worsens with the above plan.   Philis Fendt, MHS, PA-C [facility] 02/23/2017 2:13 PM

## 2017-02-24 LAB — LIPID PANEL
Chol/HDL Ratio: 2.6 ratio (ref 0.0–5.0)
Cholesterol, Total: 111 mg/dL (ref 100–199)
HDL: 42 mg/dL (ref >39)
LDL CALC: 35 mg/dL (ref 0–99)
TRIGLYCERIDES: 170 mg/dL — AB (ref 0–149)
VLDL CHOLESTEROL CAL: 34 mg/dL (ref 5–40)

## 2017-02-24 LAB — CMP14+EGFR
A/G RATIO: 2.4 — AB (ref 1.2–2.2)
ALBUMIN: 4.6 g/dL (ref 3.6–4.8)
ALT: 30 IU/L (ref 0–44)
AST: 34 IU/L (ref 0–40)
Alkaline Phosphatase: 70 IU/L (ref 39–117)
BILIRUBIN TOTAL: 1.4 mg/dL — AB (ref 0.0–1.2)
BUN/Creatinine Ratio: 14 (ref 10–24)
BUN: 16 mg/dL (ref 8–27)
CHLORIDE: 97 mmol/L (ref 96–106)
CO2: 23 mmol/L (ref 18–29)
Calcium: 9.6 mg/dL (ref 8.6–10.2)
Creatinine, Ser: 1.14 mg/dL (ref 0.76–1.27)
GFR calc Af Amer: 79 mL/min/{1.73_m2} (ref >59)
GFR, EST NON AFRICAN AMERICAN: 68 mL/min/{1.73_m2} (ref >59)
Globulin, Total: 1.9 g/dL (ref 1.5–4.5)
Glucose: 87 mg/dL (ref 65–99)
Potassium: 4.5 mmol/L (ref 3.5–5.2)
Sodium: 138 mmol/L (ref 134–144)
Total Protein: 6.5 g/dL (ref 6.0–8.5)

## 2017-03-20 ENCOUNTER — Telehealth: Payer: Self-pay | Admitting: Physician Assistant

## 2017-03-20 NOTE — Telephone Encounter (Signed)
lmtcb what for?

## 2017-03-20 NOTE — Telephone Encounter (Signed)
Pt called requesting dermatology referral. Pt trying to be seen next week if possible.

## 2017-03-21 NOTE — Telephone Encounter (Signed)
Pt scheduled to see Legrand Como on 6/8 at 8am.

## 2017-03-21 NOTE — Telephone Encounter (Signed)
Pt left message on referrals voicemail. Pt said he has had a mole on his back and went to a dermatologist before to get it frozen off but they did not end up doing it and told him that if it ever started to bother him to come back in and have it frozen off.

## 2017-03-21 NOTE — Telephone Encounter (Signed)
Please have him come in and I will take it off, with margins, and send it to pathology for analysis. Thanks Clarise Cruz.  Thank for for taking care off that stat referral yesterday btw. Pt has apointment this Thursday.

## 2017-03-24 ENCOUNTER — Ambulatory Visit: Payer: 59 | Admitting: Physician Assistant

## 2017-03-24 ENCOUNTER — Encounter: Payer: Self-pay | Admitting: Physician Assistant

## 2017-03-24 VITALS — BP 139/83 | HR 56 | Temp 97.7°F | Resp 16 | Ht 72.0 in | Wt 259.8 lb

## 2017-03-24 DIAGNOSIS — D229 Melanocytic nevi, unspecified: Secondary | ICD-10-CM

## 2017-03-24 NOTE — Patient Instructions (Addendum)
WOUND CARE: Come back anytime before ten days if you have any problems with the wound.  Please return in 10 days to have your stitches/staples removed or sooner if you have concerns. Marland Kitchen Keep area clean and dry for 24 hours. Do not remove bandage, if applied. . After 24 hours, remove bandage and wash wound gently with mild soap and warm water. Reapply a new bandage after cleaning wound, if directed. . Continue daily cleansing with soap and water until stitches/staples are removed. . Do not apply any ointments or creams to the wound while stitches/staples are in place, as this may cause delayed healing. . Notify the office if you experience any of the following signs of infection: Swelling, redness, pus drainage, streaking, fever >101.0 F . Notify the office if you experience excessive bleeding that does not stop after 15-20 minutes of constant, firm pressure.     IF you received an x-ray today, you will receive an invoice from [facility]. Please contact [facility] at [phone] with questions or concerns regarding your invoice.   IF you received labwork today, you will receive an invoice from [facility]. Please contact [facility] at [phone] with questions or concerns regarding your invoice.   Our billing staff will not be able to assist you with questions regarding bills from these companies.  You will be contacted with the lab results as soon as they are available. The fastest way to get your results is to activate your My Chart account. Instructions are located on the last page of this paperwork. If you have not heard from [place] regarding the results in 2 weeks, please contact this office.

## 2017-03-24 NOTE — Progress Notes (Signed)
    03/24/2017 9:09 AM   DOB: 1956/05/22 / MRN: [id number]  SUBJECTIVE:  Joe Todd is a 61 y.o. male presenting for a mole on his back that has started to cause irritation. Derm had looked at this some time back and advised he have the lesion taken off if it started to bother him.  He denies any pain today.  He would like the lesion removed.   He is allergic to penicillins.   He  has a past medical history of NSAID long-term use (01/23/2017).    He  reports that he has never smoked. He has never used smokeless tobacco. He reports that he drinks about 4.2 oz of alcohol per week . He reports that he does not use drugs. He  has no sexual activity history on file. 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  His family history includes Cancer in his mother; Diabetes in his father; Heart disease in his father; Hyperlipidemia in his father; Hypertension in his father.  Review of Systems  Constitutional: Negative for chills, diaphoresis and fever.  Gastrointestinal: Negative for nausea.  Skin: Positive for rash. Negative for itching.  Neurological: Negative for dizziness.    The problem list and medications were reviewed and updated by myself where necessary and exist elsewhere in the encounter.   OBJECTIVE:  BP 139/83 (BP Location: Right Arm, Patient Position: Sitting, Cuff Size: Large)   Pulse (!) 56   Temp 97.7 F (36.5 C) (Oral)   Resp 16   Ht 6' (1.829 m)   Wt 259 lb 12.8 oz (117.8 kg)   SpO2 98%   BMI 35.24 kg/m   Physical Exam  Constitutional: He appears well-developed. He is active and cooperative.  Non-toxic appearance.  Cardiovascular: Normal rate.   Pulmonary/Chest: Effort normal. No tachypnea.  Neurological: He is alert.  Skin: Skin is warm and dry. He is not diaphoretic. No pallor.     Vitals reviewed.  .Procedure: Risk and benefits discussed and verbal consent obtained.  The patient was anesthetized using 5 cc of 1:1 mix of 1% lidocaine with epi and Marcaie. Elliptical excision with margins.  Wound approximated with 3-0 VIcryl.  Abx dressing placed. Patient tolerated procedure without complaint.   No results found for this or any previous visit (from the past 72 hour(s)).  No results found.  ASSESSMENT AND PLAN:  Joe Todd was seen today for mole removal.  Diagnoses and all orders for this visit:  Suspicious nevus: Removed with margins.  Path report pending. RTC in 10 days for suture removal. Sooner if needed.  -     Dermatology pathology    The patient is advised to call or return to clinic if he does not see an improvement in symptoms, or to seek the care of the closest emergency department if he worsens with the above plan.   Philis Fendt, MHS, PA-C [facility] 03/24/2017 9:09 AM

## 2017-04-03 ENCOUNTER — Encounter: Payer: Self-pay | Admitting: Physician Assistant

## 2017-04-03 ENCOUNTER — Ambulatory Visit (INDEPENDENT_AMBULATORY_CARE_PROVIDER_SITE_OTHER): Payer: 59 | Admitting: Physician Assistant

## 2017-04-03 VITALS — BP 136/73 | HR 67 | Resp 16 | Ht 72.0 in | Wt 260.4 lb

## 2017-04-03 DIAGNOSIS — Z4802 Encounter for removal of sutures: Secondary | ICD-10-CM

## 2017-04-03 NOTE — Progress Notes (Signed)
Patient here for suture removal.  Denies complications with the wound. Wound well approximated with 4 HM sutures and is without tenderness or exudate.  4 sutures removed without complication. Patient to follow up as needed. Philis Fendt, MS, PA-C 5:07 PM, 04/03/2017

## 2017-04-03 NOTE — Patient Instructions (Signed)
     IF you received an x-ray today, you will receive an invoice from [facility]. Please contact [facility] at [phone] with questions or concerns regarding your invoice.   IF you received labwork today, you will receive an invoice from [facility]. Please contact [facility] at [phone] with questions or concerns regarding your invoice.   Our billing staff will not be able to assist you with questions regarding bills from these companies.  You will be contacted with the lab results as soon as they are available. The fastest way to get your results is to activate your My Chart account. Instructions are located on the last page of this paperwork. If you have not heard from us regarding the results in 2 weeks, please contact this office.     

## 2017-04-12 ENCOUNTER — Ambulatory Visit (INDEPENDENT_AMBULATORY_CARE_PROVIDER_SITE_OTHER): Payer: 59 | Admitting: Physician Assistant

## 2017-04-12 ENCOUNTER — Encounter: Payer: Self-pay | Admitting: Physician Assistant

## 2017-04-12 VITALS — BP 118/78 | HR 63 | Resp 16 | Ht 72.0 in | Wt 258.0 lb

## 2017-04-12 DIAGNOSIS — Z4889 Encounter for other specified surgical aftercare: Secondary | ICD-10-CM

## 2017-04-12 NOTE — Progress Notes (Signed)
   04/12/2017 11:44 AM   DOB: 23-Mar-1956 / MRN: [id number]  SUBJECTIVE:  Joe Todd is a well appearing 61 y.o. here today for wound care. He denies exquisite tenderness at the site of the wound, nausea, emesis, fever and chills.  He has been compliant with medical therapy and recommendations thus far. Noticed the wound opened roughly 2 days ago.    He is allergic to penicillins.    Review of Systems  Constitutional: Negative for chills, diaphoresis and fever.  Gastrointestinal: Negative for nausea.  Skin: Negative for rash.  Neurological: Negative for dizziness.    Problem list and medications reviewed and updated by myself where necessary, and exist elsewhere in the encounter.   OBJECTIVE:  BP 118/78 (BP Location: Right Arm, Patient Position: Sitting, Cuff Size: Large)   Pulse 63   Resp 16   Ht 6' (1.829 m)   Wt 258 lb (117 kg)   SpO2 97%   BMI 34.99 kg/m  CrCl cannot be calculated (Patient's most recent lab result is older than the maximum 21 days allowed.).  Physical Exam  Constitutional: He is active and cooperative.  Cardiovascular: Normal rate.   Pulmonary/Chest: Effort normal. No tachypnea.  Neurological: He is alert.  Skin: Skin is warm.     Vitals reviewed.   No results found for this or any previous visit (from the past 48 hour(s)).  ASSESSMENT AND PLAN  Joe Todd was seen today for follow-up.  Diagnoses and all orders for this visit:  Encounter for postoperative wound care: Wound has dehisced. Advised that apply mupirocin daily along with a bandaid.  He can come back any time for this so I can look at the wound if he needs me to.     The patient was advised to call or return to clinic if he does not see an improvement in symptoms or to seek the care of the closest emergency department if he worsens with the above plan.   Philis Fendt, MHS, PA-C [facility] 04/12/2017 11:44 AM

## 2017-04-12 NOTE — Patient Instructions (Signed)
     IF you received an x-ray today, you will receive an invoice from [facility]. Please contact [facility] at [phone] with questions or concerns regarding your invoice.   IF you received labwork today, you will receive an invoice from [facility]. Please contact [facility] at [phone] with questions or concerns regarding your invoice.   Our billing staff will not be able to assist you with questions regarding bills from these companies.  You will be contacted with the lab results as soon as they are available. The fastest way to get your results is to activate your My Chart account. Instructions are located on the last page of this paperwork. If you have not heard from us regarding the results in 2 weeks, please contact this office.     

## 2017-05-26 ENCOUNTER — Encounter: Payer: Self-pay | Admitting: Physician Assistant

## 2017-05-26 ENCOUNTER — Ambulatory Visit (INDEPENDENT_AMBULATORY_CARE_PROVIDER_SITE_OTHER): Payer: 59 | Admitting: Physician Assistant

## 2017-05-26 VITALS — BP 124/68 | HR 67 | Temp 98.1°F | Resp 18 | Ht 72.0 in | Wt 258.0 lb

## 2017-05-26 DIAGNOSIS — E1165 Type 2 diabetes mellitus with hyperglycemia: Secondary | ICD-10-CM

## 2017-05-26 DIAGNOSIS — N529 Male erectile dysfunction, unspecified: Secondary | ICD-10-CM | POA: Diagnosis not present

## 2017-05-26 LAB — POCT GLYCOSYLATED HEMOGLOBIN (HGB A1C): Hemoglobin A1C: 5.9

## 2017-05-26 MED ORDER — METFORMIN HCL 500 MG PO TABS
500.0000 mg | ORAL_TABLET | Freq: Two times a day (BID) | ORAL | 1 refills | Status: DC
Start: 2017-05-26 — End: 2017-05-31

## 2017-05-26 MED ORDER — SILDENAFIL CITRATE 20 MG PO TABS: 20.0000 mg | ORAL_TABLET | Freq: Every day | ORAL | 1 refills | Status: DC | PRN

## 2017-05-26 NOTE — Progress Notes (Signed)
05/26/2017 2:48 PM   DOB: 1956/04/09 / MRN: [id number]  SUBJECTIVE:  Joe Todd is a 61 y.o. male presenting for diabetes check. Has been seen in endo in the past.  Is taking a statin. Is not taking BP medication. He needs a microalbumin as this has not been checked. Last A1c was well controlled. Is taking 500 metformin tid. He is working a new job and this requires a lot of physical activity.  Has come down in pants size from a 46 to a 40. Has been have some back pain and has been taking 1500 mg of tylenol.   Tells me he has a difficult time achieving enough firmness for vaginal penetration.  Has need viagra in the past. Would like to try this again and states he would like to have enough medication for 2 times weekly.   He is allergic to penicillins.   He  has a past medical history of NSAID long-term use (01/23/2017).    He  reports that he has never smoked. He has never used smokeless tobacco. He reports that he drinks about 4.2 oz of alcohol per week . He reports that he does not use drugs. He  has no sexual activity history on file. 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  His family history includes Cancer in his mother; Diabetes in his father; Heart disease in his father; Hyperlipidemia in his father; Hypertension in his father.  Review of Systems  Constitutional: Negative for chills and fever.  Musculoskeletal: Negative for myalgias.  Skin: Negative for itching and rash.  Neurological: Negative for dizziness.    The problem list and medications were reviewed and updated by myself where necessary and exist elsewhere in the encounter.   OBJECTIVE:  BP 124/68   Pulse 67   Temp 98.1 F (36.7 C) (Oral)   Resp 18   Ht 6' (1.829 m)   Wt 258 lb (117 kg)   SpO2 97%   BMI 34.99 kg/m   Physical Exam  Constitutional: He is oriented to person, place, and time. He appears  well-developed. He is active and cooperative.  Non-toxic appearance.  Cardiovascular: Normal rate.   Pulmonary/Chest: Effort normal. No tachypnea.  Neurological: He is alert and oriented to person, place, and time.  Skin: Skin is warm and dry. He is not diaphoretic. No pallor.     Vitals reviewed.   Lab Results  Component Value Date   CREATININE 1.14 02/23/2017   BUN 16 02/23/2017   NA 138 02/23/2017   K 4.5 02/23/2017   CL 97 02/23/2017   CO2 23 02/23/2017   Lab Results  Component Value Date   ALT 30 02/23/2017   AST 34 02/23/2017   ALKPHOS 70 02/23/2017   BILITOT 1.4 (H) 02/23/2017   Lab Results  Component Value Date   WBC 6.1 11/17/2016   HGB 14.1 11/17/2016   HCT 43.1 11/17/2016   MCV 94 11/17/2016   PLT 217 11/17/2016   Lab Results  Component Value Date   HGBA1C 5.9 05/26/2017   Wt Readings from Last 3 Encounters:  05/26/17 258 lb (117 kg)  04/12/17 258 lb (117 kg)  04/03/17 260 lb 6.4 oz (118.1 kg)   Results for orders placed or performed in visit on 05/26/17 (from the past 72 hour(s))  POCT glycosylated hemoglobin (Hb A1C)     Status: None   Collection Time: 05/26/17  2:41  PM  Result Value Ref Range   Hemoglobin A1C 5.9     No results found.  ASSESSMENT AND PLAN:  Astor was seen today for follow-up.  Diagnoses and all orders for this visit:  Type 2 diabetes mellitus with hyperglycemia, without long-term current use of insulin ([place]): Well controlled. Dropping metformin by 1/3.  Continuing statin.  Encouraged good eating and that he continue exercise.   -     POCT glycosylated hemoglobin (Hb A1C) -     Cancel: Microalbumin, urine -     Bilirubin, total  Erectile dysfunction, unspecified erectile dysfunction type -     sildenafil (REVATIO) 20 MG tablet; Take 1-4 tablets (20-80 mg total) by mouth daily as needed.    The patient is advised to call or return to clinic if he does not see an improvement in symptoms, or to seek the care of the closest  emergency department if he worsens with the above plan.   Philis Fendt, MHS, PA-C [facility] 05/26/2017 2:48 PM

## 2017-05-26 NOTE — Patient Instructions (Addendum)
  Try to use no more than 40 to 60 mg, but up to one hundred is okay.  Back off the dose if you get dizzy.    Lab Results  Component Value Date   CREATININE 1.14 02/23/2017   BUN 16 02/23/2017   NA 138 02/23/2017   K 4.5 02/23/2017   CL 97 02/23/2017   CO2 23 02/23/2017    Lab Results  Component Value Date   ALT 30 02/23/2017   AST 34 02/23/2017   ALKPHOS 70 02/23/2017   BILITOT 1.4 (H) 02/23/2017    IF you received an x-ray today, you will receive an invoice from [facility]. Please contact [facility] at [phone] with questions or concerns regarding your invoice.   IF you received labwork today, you will receive an invoice from [facility]. Please contact [facility] at [phone] with questions or concerns regarding your invoice.   Our billing staff will not be able to assist you with questions regarding bills from these companies.  You will be contacted with the lab results as soon as they are available. The fastest way to get your results is to activate your My Chart account. Instructions are located on the last page of this paperwork. If you have not heard from [place] regarding the results in 2 weeks, please contact this office.

## 2017-05-27 LAB — BILIRUBIN, TOTAL: Bilirubin Total: 0.7 mg/dL (ref 0.0–1.2)

## 2017-05-31 ENCOUNTER — Other Ambulatory Visit: Payer: Self-pay

## 2017-05-31 MED ORDER — METFORMIN HCL 500 MG PO TABS: 500.0000 mg | ORAL_TABLET | Freq: Two times a day (BID) | ORAL | 1 refills | Status: DC

## 2017-07-27 ENCOUNTER — Emergency Department (HOSPITAL_COMMUNITY)
Admission: EM | Admit: 2017-07-27 | Discharge: 2017-07-27 | Disposition: A | Discharge status: Home / Self Care | Payer: 59 | Attending: Emergency Medicine | Admitting: Emergency Medicine

## 2017-07-27 ENCOUNTER — Encounter (HOSPITAL_COMMUNITY): Payer: Self-pay | Admitting: *Deleted

## 2017-07-27 ENCOUNTER — Other Ambulatory Visit: Payer: Self-pay | Admitting: Physician Assistant

## 2017-07-27 ENCOUNTER — Emergency Department (HOSPITAL_COMMUNITY): Payer: 59

## 2017-07-27 DIAGNOSIS — R0789 Other chest pain: Secondary | ICD-10-CM | POA: Diagnosis not present

## 2017-07-27 DIAGNOSIS — Z79899 Other long term (current) drug therapy: Secondary | ICD-10-CM | POA: Diagnosis not present

## 2017-07-27 DIAGNOSIS — R079 Chest pain, unspecified: Secondary | ICD-10-CM | POA: Diagnosis present

## 2017-07-27 DIAGNOSIS — Z7984 Long term (current) use of oral hypoglycemic drugs: Secondary | ICD-10-CM | POA: Diagnosis not present

## 2017-07-27 DIAGNOSIS — E1165 Type 2 diabetes mellitus with hyperglycemia: Secondary | ICD-10-CM | POA: Insufficient documentation

## 2017-07-27 HISTORY — DX: Type 2 diabetes mellitus without complications: E11.9

## 2017-07-27 LAB — CBC
HCT: 42.6 % (ref 39.0–52.0)
Hemoglobin: 13.9 g/dL (ref 13.0–17.0)
MCH: 31.4 pg (ref 26.0–34.0)
MCHC: 32.6 g/dL (ref 30.0–36.0)
MCV: 96.2 fL (ref 78.0–100.0)
PLATELETS: 223 10*3/uL (ref 150–400)
RBC: 4.43 MIL/uL (ref 4.22–5.81)
RDW: 13.5 % (ref 11.5–15.5)
WBC: 6.6 10*3/uL (ref 4.0–10.5)

## 2017-07-27 LAB — BASIC METABOLIC PANEL
ANION GAP: 9 (ref 5–15)
BUN: 19 mg/dL (ref 6–20)
CALCIUM: 9.7 mg/dL (ref 8.9–10.3)
CO2: 25 mmol/L (ref 22–32)
CREATININE: 1 mg/dL (ref 0.61–1.24)
Chloride: 103 mmol/L (ref 101–111)
GLUCOSE: 150 mg/dL — AB (ref 65–99)
Potassium: 4.4 mmol/L (ref 3.5–5.1)
Sodium: 137 mmol/L (ref 135–145)

## 2017-07-27 LAB — I-STAT TROPONIN, ED
TROPONIN I, POC: 0 ng/mL (ref 0.00–0.08)
Troponin i, poc: 0 ng/mL (ref 0.00–0.08)

## 2017-07-27 NOTE — ED Triage Notes (Signed)
Stretched and got sharp left chest pain that stayed for a while and then had pain in left arm.  Pt reports tired for the last week and has been urinating a lot recently, patient is diabetic130. Pt has annoying pain to left chest and to back shoulder blade

## 2017-07-27 NOTE — Discharge Instructions (Signed)
It was my pleasure taking care of you today!   You were seen in the Emergency Department today for chest pain.  As we have discussed, today?s blood work and imaging are normal, but you may require further testing.  Please call your primary care physician to schedule a follow up appointment to discuss your ER visit today.   Please call the cardiology clinic listed to schedule a follow up appointment for further evaluation of the chest pain you experienced today.   Continue to take your regular medications.  Return to the Emergency Department if you experience any further chest pain/pressure/tightness, difficulty breathing, new or worsening symptoms, any additional concerns.

## 2017-07-27 NOTE — ED Provider Notes (Signed)
[facility] DEPT Provider Note   CSN: [id number] Arrival date & time: 07/27/17  1050     History   Chief Complaint Chief Complaint  Patient presents with  . Chest Pain    HPI Joe Todd is a 61 y.o. male.  The history is provided by the patient and medical records. No language interpreter was used.    Joe Todd is a 61 y.o. male  with a PMH of DM2 who presents to the Emergency Department complaining of sharp left-sided chest pain which began this morning when he awoke. Patient states that he got up, stretched his arms up and immediately felt a sharp pain. The pain is to the left side of the chest and will radiate around to lateral side of left chest. This lasted a few minutes, then resolved. Initially, he thought he may have just pulled a muscle, but pain continued to linger more than he thought. He was driving and pain returned when he placed his arm on the steering wheel. 30 seconds to a minute later, he put arm down and pain resolved. Pain is worse with certain movements. He did take a 81 mg aspirin prior to arrival. No other medications taken. No shortness of breath, nausea, vomiting, diaphoresis, back pain, numbness or tingling. Denies any other associated symptoms. No known cardiac history. Never smoked.  Past Medical History:  Diagnosis Date  . Diabetes mellitus without complication ([place])   . NSAID long-term use 01/23/2017   Advised he try to use Tylenol 1000 mg q8 to avoid long term complications. He is currently taking 800 mg in the morning and two aleve PM at night.     Patient Active Problem List   Diagnosis Date Noted  . Type 2 diabetes mellitus with hyperglycemia ([place]) 01/26/2016  . Transaminitis 01/26/2016    Past Surgical History:  Procedure Laterality Date  . Bilateral shoulder surgery    . HERNIA REPAIR    . 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  Authorizing Provider  atorvastatin (LIPITOR) 20 MG tablet Take 1 tablet (20 mg total) by mouth daily. Take 1 half tab daily for the first week, then take the full tab. 12/22/16   Tereasa Coop, PA-C  glucose blood [facility] VERIO) test strip Use 4x a day as advised: One Touch Verio flex 02/23/17   Tereasa Coop, PA-C  ibuprofen (ADVIL,MOTRIN) 200 MG tablet Take 800 mg by mouth 2 (two) times daily. Pain    [provider]  metFORMIN (GLUCOPHAGE) 500 MG tablet Take 1 tablet (500 mg total) by mouth 2 (two) times daily with a meal. Take two tabs of metformin daily with food. 05/31/17   Tereasa Coop, PA-C  ONE TOUCH LANCETS MISC Use once a day: One Touch Verio flex 11/20/15   Philemon Kingdom, MD  sildenafil (REVATIO) 20 MG tablet Take 1-4 tablets (20-80 mg total) by mouth daily as needed. 05/26/17   Tereasa Coop, PA-C    Family History Family History  Problem Relation Age of Onset  . Cancer Mother   . Hyperlipidemia Father   . Hypertension Father   . Heart disease Father   . Diabetes Father     Social History Social History  Substance Use Topics  . Smoking status: Never Smoker  . Smokeless tobacco: Never Used  . Alcohol use 4.2 oz/week    3 Shots  of liquor, 4 Glasses of wine per week     Comment: every weekend     Allergies   Penicillins   Review of Systems Review of Systems  Cardiovascular: Positive for chest pain. Negative for palpitations and leg swelling.  Musculoskeletal: Positive for myalgias.  All other systems reviewed and are negative.    Physical Exam Updated Vital Signs BP 124/72 (BP Location: Left Arm)   Pulse 70   Temp 98.3 F (36.8 C) (Oral)   Resp 16   SpO2 93%   Physical Exam  Constitutional: He is oriented to person, place, and time. He appears well-developed and well-nourished. No distress.  HENT:  Head: Normocephalic and atraumatic.  Neck: No JVD present.  Cardiovascular: Normal rate, regular rhythm and normal heart sounds.   No  murmur heard. Pulmonary/Chest: Effort normal and breath sounds normal. No respiratory distress. He has no wheezes. He has no rales. He exhibits tenderness.  Abdominal: Soft. He exhibits no distension. There is no tenderness.  Musculoskeletal: He exhibits no edema.  Neurological: He is alert and oriented to person, place, and time.  Skin: Skin is warm and dry.  Nursing note and vitals reviewed.    ED Treatments / Results  Labs (all labs ordered are listed, but only abnormal results are displayed) Labs Reviewed  BASIC METABOLIC PANEL - Abnormal; Notable for the following:       Result Value   Glucose, Bld 150 (*)    All other components within normal limits  CBC  I-STAT TROPONIN, ED  I-STAT TROPONIN, ED    EKG  EKG Interpretation  Date/Time:  Thursday July 27 2017 10:55:05 EDT Ventricular Rate:  72 PR Interval:  180 QRS Duration: 98 QT Interval:  366 QTC Calculation: 400 R Axis:   -2 Text Interpretation:  Normal sinus rhythm Cannot rule out Anterior infarct , age undetermined Abnormal ECG similar to 2014 Confirmed by Sherwood Gambler [phone]) on 07/27/2017 1:32:59 PM       Radiology Dg Chest 2 View  Result Date: 07/27/2017 CLINICAL DATA:  Onset of sharp left chest pain/arm pain this am EXAM: CHEST  2 VIEW COMPARISON:  Chest x-ray dated 12/17/2012. FINDINGS: Study is hypoinspiratory with crowding of the perihilar and bibasilar bronchovascular markings. Given the slightly low lung volumes, lungs appear clear. No pleural effusion or pneumothorax seen. Heart size and mediastinal contours appear stable. No acute or suspicious osseous finding. Mild degenerative changes again noted within the mid and lower thoracic spine. IMPRESSION: No active cardiopulmonary disease. No evidence of pneumonia or pulmonary edema. Electronically Signed   By: Franki Cabot M.D.   On: 07/27/2017 11:45    Procedures Procedures (including critical care time)  Medications Ordered in ED Medications -  No data to display   Initial Impression / Assessment and Plan / ED Course  I have reviewed the triage vital signs and the nursing notes.  Pertinent labs & imaging results that were available during my care of the patient were reviewed by me and considered in my medical decision making (see chart for details).    Joe Todd is a 61 y.o. male who presents to ED for chest and lateral side pain which occurred this morning immediately after lifting arms up stretching. Pain improved, but then returned again when he lifted his arm up to place it on the steering wheel when driving. Upon evaluation, patient is pain-free. Afebrile, hemodynamically stable with tenderness to area of discomfort, but otherwise normal cardiopulmonary exam. Only PMH is hx of  DM which appears fairly well-controlled with last A1C of 5.9. CXR with no acute findings. EKG unchanged from previous. Labs reassuring. Appears very much musk but given age and hx of DM, troponin x 2 obtained and negative. Patient has good PCP follow up. Will also provided cardiology follow up. Reasons to return to ER discussed and all questions answered.    Patient discussed with Dr. Regenia Skeeter who agrees with treatment plan.   Final Clinical Impressions(s) / ED Diagnoses   Final diagnoses:  Atypical chest pain    New Prescriptions New Prescriptions   No medications on file     Ward, Ozella Almond, PA-C 07/27/17 1509    Sherwood Gambler, MD 07/28/17 2315

## 2017-07-28 ENCOUNTER — Encounter: Payer: Self-pay | Admitting: Physician Assistant

## 2017-07-28 ENCOUNTER — Telehealth: Payer: Self-pay | Admitting: Physician Assistant

## 2017-07-28 ENCOUNTER — Ambulatory Visit (INDEPENDENT_AMBULATORY_CARE_PROVIDER_SITE_OTHER): Payer: 59 | Admitting: Physician Assistant

## 2017-07-28 VITALS — BP 130/64 | HR 70 | Resp 16 | Ht 72.0 in | Wt 251.6 lb

## 2017-07-28 DIAGNOSIS — G8929 Other chronic pain: Secondary | ICD-10-CM | POA: Diagnosis not present

## 2017-07-28 DIAGNOSIS — L02414 Cutaneous abscess of left upper limb: Secondary | ICD-10-CM

## 2017-07-28 DIAGNOSIS — M545 Low back pain: Secondary | ICD-10-CM | POA: Diagnosis not present

## 2017-07-28 MED ORDER — DOXYCYCLINE HYCLATE 100 MG PO CAPS: 100.0000 mg | ORAL_CAPSULE | Freq: Two times a day (BID) | ORAL | 0 refills | Status: AC

## 2017-07-28 NOTE — Telephone Encounter (Signed)
Done. Pt advised.

## 2017-07-28 NOTE — Telephone Encounter (Signed)
Pt was seen on 10/12 we sent his doxycycline (VIBRAMYCIN) 100 MG capsule [[id number]] to [id number] [place] [place] [facility] [address] [place] [address]. They are closed, he would like [place] to sent this script [facility] [place] [place] [address]   Contact number [phone] Store [phone]

## 2017-07-28 NOTE — Telephone Encounter (Signed)
Pt called back at 3:05 to let us know that he still does not have his RX at [facility].  Feels that he needs this RX and wants it. Very upset.  I spoke with Jenny Reichmann and he assures me that he will get it taken care of today.

## 2017-07-28 NOTE — Patient Instructions (Addendum)
Clotrimazole for arm rash      IF you received an x-ray today, you will receive an invoice from [facility]. Please contact [facility] Radiology at [phone] with questions or concerns regarding your invoice.   IF you received labwork today, you will receive an invoice from [place]. Please contact [facility] at [phone] with questions or concerns regarding your invoice.   Our billing staff will not be able to assist you with questions regarding bills from these companies.  You will be contacted with the lab results as soon as they are available. The fastest way to get your results is to activate your My Chart account. Instructions are located on the last page of this paperwork. If you have not heard from [place] regarding the results in 2 weeks, please contact this office.

## 2017-07-28 NOTE — Progress Notes (Signed)
07/28/2017 11:15 AM   DOB: 31-Dec-1955 / MRN: [id number]  SUBJECTIVE:  Joe Todd is a 61 y.o. male presenting for a cyst on the right arm. This has been present for about two weeks and is getting worse.  Denies fever and chills.    He has a history of chronic back pain and has gotten by with mostly Tylneol and some NSAIDS.  No imaging exist in the system.  He would like a referral to orthopedics today because he would like to consider injections of his back.    He is allergic to penicillins.   He  has a past medical history of Diabetes mellitus without complication ([place]) and NSAID long-term use (01/23/2017).    He  reports that he has never smoked. He has never used smokeless tobacco. He reports that he drinks about 4.2 oz of alcohol per week . He reports that he does not use drugs. He  has no sexual activity history on file. 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  His family history includes Cancer in his mother; Diabetes in his father; Heart disease in his father; Hyperlipidemia in his father; Hypertension in his father.  Review of Systems  Musculoskeletal: Positive for back pain and myalgias. Negative for falls and neck pain.  Skin: Positive for itching and rash.  Neurological: Negative for dizziness.    The problem list and medications were reviewed and updated by myself where necessary and exist elsewhere in the encounter.   OBJECTIVE:  BP 130/64 (BP Location: Right Arm, Patient Position: Sitting, Cuff Size: Large)   Pulse 70   Resp 16   Ht 6' (1.829 m)   Wt 251 lb 9.6 oz (114.1 kg)   SpO2 98%   BMI 34.12 kg/m   Physical Exam  Constitutional: He is oriented to person, place, and time. He appears well-developed. He is active and cooperative.  Non-toxic appearance.  Eyes: Pupils are equal, round, and reactive to light. EOM are normal.  Cardiovascular: Normal rate.     Pulmonary/Chest: Effort normal. No tachypnea.  Neurological: He is alert and oriented to person, place, and time. He has normal strength and normal reflexes. He is not disoriented. No cranial nerve deficit or sensory deficit. He exhibits normal muscle tone. Coordination and gait normal.  Skin: Skin is warm and dry. He is not diaphoretic. No pallor.  Psychiatric: His behavior is normal.  Vitals reviewed.   Results for orders placed or performed during the hospital encounter of 07/27/17 (from the past 72 hour(s))  Basic metabolic panel     Status: Abnormal   Collection Time: 07/27/17 11:19 AM  Result Value Ref Range   Sodium 137 135 - 145 mmol/L   Potassium 4.4 3.5 - 5.1 mmol/L   Chloride 103 101 - 111 mmol/L   CO2 25 22 - 32 mmol/L   Glucose, Bld 150 (H) 65 - 99 mg/dL   BUN 19 6 - 20 mg/dL   Creatinine, Ser 1.00 0.61 - 1.24 mg/dL   Calcium 9.7 8.9 - 10.3 mg/dL   GFR calc non Af Amer >60 >60 mL/min   GFR calc Af Amer >60 >60 mL/min    Comment: (NOTE) The eGFR has been calculated using the CKD EPI equation. This calculation has not been validated in all clinical situations. eGFR's persistently <60 mL/min signify possible Chronic Kidney Disease.    Anion gap 9 5 - 15  CBC  Status: None   Collection Time: 07/27/17 11:19 AM  Result Value Ref Range   WBC 6.6 4.0 - 10.5 K/uL   RBC 4.43 4.22 - 5.81 MIL/uL   Hemoglobin 13.9 13.0 - 17.0 g/dL   HCT 42.6 39.0 - 52.0 %   MCV 96.2 78.0 - 100.0 fL   MCH 31.4 26.0 - 34.0 pg   MCHC 32.6 30.0 - 36.0 g/dL   RDW 13.5 11.5 - 15.5 %   Platelets 223 150 - 400 K/uL  I-stat troponin, ED     Status: None   Collection Time: 07/27/17 11:38 AM  Result Value Ref Range   Troponin i, poc 0.00 0.00 - 0.08 ng/mL   Comment 3            Comment: Due to the release kinetics of cTnI, a negative result within the first hours of the onset of symptoms does not rule out myocardial infarction with certainty. If myocardial infarction is still  suspected, repeat the test at appropriate intervals.   I-stat troponin, ED     Status: None   Collection Time: 07/27/17  2:47 PM  Result Value Ref Range   Troponin i, poc 0.00 0.00 - 0.08 ng/mL   Comment 3            Comment: Due to the release kinetics of cTnI, a negative result within the first hours of the onset of symptoms does not rule out myocardial infarction with certainty. If myocardial infarction is still suspected, repeat the test at appropriate intervals.     Dg Chest 2 View  Result Date: 07/27/2017 CLINICAL DATA:  Onset of sharp left chest pain/arm pain this am EXAM: CHEST  2 VIEW COMPARISON:  Chest x-ray dated 12/17/2012. FINDINGS: Study is hypoinspiratory with crowding of the perihilar and bibasilar bronchovascular markings. Given the slightly low lung volumes, lungs appear clear. No pleural effusion or pneumothorax seen. Heart size and mediastinal contours appear stable. No acute or suspicious osseous finding. Mild degenerative changes again noted within the mid and lower thoracic spine. IMPRESSION: No active cardiopulmonary disease. No evidence of pneumonia or pulmonary edema. Electronically Signed   By: Franki Cabot M.D.   On: 07/27/2017 11:45    ASSESSMENT AND PLAN:  Mivaan was seen today for cyst.  Diagnoses and all orders for this visit:  Abscess of left arm -     WOUND CULTURE -     doxycycline (VIBRAMYCIN) 100 MG capsule; Take 1 capsule (100 mg total) by mouth 2 (two) times daily.  Chronic low back pain, unspecified back pain laterality, with sciatica presence unspecified: He has gotten by on OTC meds however he is not a fan of taking medications chronically and would like to explore longer last options, even if this involves a procedure. I tend to agree with him as he does have diabetes and NSAIDS can complicate his already existing elevated risk of ASCVD.  I will refer him so he can discuss his options for chronic pain management, and he is particularly  interested in back injections.  He would like to get in within the next two weeks if possible.  -     Ambulatory referral to Neurosurgery    The patient is advised to call or return to clinic if he does not see an improvement in symptoms, or to seek the care of the closest emergency department if he worsens with the above plan.   Philis Fendt, MHS, PA-C [facility] 07/28/2017  11:15 AM

## 2017-07-31 ENCOUNTER — Encounter: Payer: Self-pay | Admitting: Physician Assistant

## 2017-07-31 ENCOUNTER — Ambulatory Visit (INDEPENDENT_AMBULATORY_CARE_PROVIDER_SITE_OTHER): Payer: 59 | Admitting: Physician Assistant

## 2017-07-31 VITALS — HR 63 | Resp 16 | Ht 72.0 in | Wt 256.4 lb

## 2017-07-31 DIAGNOSIS — Z4889 Encounter for other specified surgical aftercare: Secondary | ICD-10-CM

## 2017-07-31 LAB — WOUND CULTURE

## 2017-07-31 NOTE — Progress Notes (Signed)
   07/31/2017 9:46 AM   DOB: Dec 09, 1955 / MRN: [id number]  SUBJECTIVE:  Mr. Haughey is a well appearing 61 y.o. here today for wound care. He denies exquisite tenderness at the site of the wound, nausea, emesis, fever and chills.  He has been compliant with medical therapy and recommendations thus far.   He is allergic to penicillins.    Review of Systems  Constitutional: Negative for chills, diaphoresis and fever.  Gastrointestinal: Negative for nausea.  Skin: Negative for rash.  Neurological: Negative for dizziness.    Problem list and medications reviewed and updated by myself where necessary, and exist elsewhere in the encounter.   OBJECTIVE:  Pulse 63   Resp 16   Ht 6' (1.829 m)   Wt 256 lb 6.4 oz (116.3 kg)   SpO2 95%   BMI 34.77 kg/m  Estimated Creatinine Clearance: 99.6 mL/min (by C-G formula based on SCr of 1 mg/dL).  Physical Exam  Constitutional: He is active and cooperative.  Cardiovascular: Normal rate.   Pulmonary/Chest: Effort normal. No tachypnea.  Neurological: He is alert.  Skin: Skin is warm.     Vitals reviewed.   No results found for this or any previous visit (from the past 48 hour(s)).  ASSESSMENT AND PLAN  Shin was seen today for wound check.  Diagnoses and all orders for this visit:  Encounter for postoperative wound care: Healing as expected. He will continue abx.      The patient was advised to call or return to clinic if he does not see an improvement in symptoms or to seek the care of the closest emergency department if he worsens with the above plan.   Philis Fendt, MHS, PA-C [facility] 07/31/2017 9:46 AM

## 2017-08-01 NOTE — Progress Notes (Signed)
Wonderful.  Dr. Kathyrn Sheriff is excellent. Philis Fendt, MS, PA-C 7:34 PM, 08/01/2017

## 2017-08-02 ENCOUNTER — Ambulatory Visit (INDEPENDENT_AMBULATORY_CARE_PROVIDER_SITE_OTHER): Payer: 59 | Admitting: Physician Assistant

## 2017-08-02 DIAGNOSIS — Z23 Encounter for immunization: Secondary | ICD-10-CM

## 2017-09-19 ENCOUNTER — Telehealth: Payer: Self-pay | Admitting: Physician Assistant

## 2017-09-19 NOTE — Telephone Encounter (Signed)
Left Detailed message per Release.   Patient will need to be seen for a referral

## 2017-09-19 NOTE — Telephone Encounter (Signed)
Copied from [place] [id number]. Topic: Quick Communication - See Telephone Encounter >> Sep 19, 2017 11:36 AM Boyd Kerbs wrote: CRM for notification. See Telephone encounter for:   Patient is asking for referral to Ortho for his left foot, He hurt it and says it is painful. He has iced it and put gels in shoes and stayed off it, but still very painful.  Told him may need to come in to see doctor, but he did not want to come in and then have to be referred.   09/19/17.

## 2017-09-21 ENCOUNTER — Ambulatory Visit: Payer: Self-pay | Admitting: Physician Assistant

## 2017-09-29 ENCOUNTER — Other Ambulatory Visit: Payer: Self-pay | Admitting: Neurosurgery

## 2017-09-29 DIAGNOSIS — M4316 Spondylolisthesis, lumbar region: Secondary | ICD-10-CM

## 2017-10-12 ENCOUNTER — Other Ambulatory Visit: Payer: 59

## 2017-12-06 ENCOUNTER — Other Ambulatory Visit: Payer: Self-pay | Admitting: Physician Assistant

## 2017-12-06 DIAGNOSIS — Z9189 Other specified personal risk factors, not elsewhere classified: Secondary | ICD-10-CM

## 2017-12-20 ENCOUNTER — Other Ambulatory Visit: Payer: Self-pay | Admitting: Physician Assistant

## 2017-12-20 NOTE — Telephone Encounter (Signed)
LOV 07/31/17 with Philis Fendt.   / Last HGB A1C done in 05/26/17 / Refill request for metformin /

## 2017-12-22 ENCOUNTER — Other Ambulatory Visit: Payer: Self-pay | Admitting: Physician Assistant

## 2018-02-05 ENCOUNTER — Telehealth: Payer: Self-pay | Admitting: Physician Assistant

## 2018-02-05 NOTE — Telephone Encounter (Signed)
[facility] [address] [facility] with the following message regarding Metformin 500mg  tablets: "Patient requests new rx: patient requests refills for 90 days so that his insurance will pay for it please and thank you".   [facility] [address] [facility], spoke with Robin. Metformin 500mg  #180 tablets was never received at pharmacy. Prescription called in.

## 2018-03-09 ENCOUNTER — Encounter: Payer: Self-pay | Admitting: Physician Assistant

## 2018-03-09 ENCOUNTER — Ambulatory Visit (INDEPENDENT_AMBULATORY_CARE_PROVIDER_SITE_OTHER): Payer: 59

## 2018-03-09 ENCOUNTER — Ambulatory Visit: Payer: 59 | Admitting: Physician Assistant

## 2018-03-09 ENCOUNTER — Other Ambulatory Visit: Payer: Self-pay

## 2018-03-09 VITALS — BP 124/80 | HR 71 | Temp 98.5°F | Ht 72.0 in | Wt 265.6 lb

## 2018-03-09 DIAGNOSIS — R0781 Pleurodynia: Secondary | ICD-10-CM

## 2018-03-09 NOTE — Patient Instructions (Addendum)
  There are no ominous findings on xray. This is either a bruised rib, which could be caused by something as mild as a cough, or a pulled muscle.  If your symptoms change or worsen then please come back so I can re-evaluate you.      Result Date: 03/09/2018 CLINICAL DATA:  Left rib pain EXAM: LEFT RIBS - 2 VIEW COMPARISON:  07/27/2017 FINDINGS: No fracture or other bone lesions are seen involving the ribs. Both lungs are clear. IMPRESSION: Negative. Electronically Signed   By: Franchot Gallo M.D.   On: 03/09/2018 10:32      IF you received an x-ray today, you will receive an invoice from [facility]. Please contact [facility] at [phone] with questions or concerns regarding your invoice.   IF you received labwork today, you will receive an invoice from [place]. Please contact [facility] at [phone] with questions or concerns regarding your invoice.   Our billing staff will not be able to assist you with questions regarding bills from these companies.  You will be contacted with the lab results as soon as they are available. The fastest way to get your results is to activate your My Chart account. Instructions are located on the last page of this paperwork. If you have not heard from [place] regarding the results in 2 weeks, please contact this office.

## 2018-03-09 NOTE — Progress Notes (Signed)
    03/09/2018 11:15 AM   DOB: January 21, 1956 / MRN: [id number]  SUBJECTIVE:  Joe Todd is a 62 y.o. male presenting for left sided lateral rip pain present for months now.  No SOB, hematuria, presyncope.  No previous injury.  No history of DVT/PE. Take aleve three tabs twice daily.  I have advised against this today and precaution him on kidney and stomach implications.   He is allergic to penicillins.   He  has a past medical history of Diabetes mellitus without complication ([place]) and NSAID long-term use (01/23/2017).    He  reports that he has never smoked. He has never used smokeless tobacco. He reports that he drinks about 4.2 oz of alcohol per week. He reports that he does not use drugs. He  has no sexual activity history on file. 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  His family history includes Cancer in his mother; Diabetes in his father; Heart disease in his father; Hyperlipidemia in his father; Hypertension in his father.  Review of Systems  Constitutional: Negative for chills and fever.  Cardiovascular: Negative for chest pain and leg swelling.  Skin: Negative for itching and rash.  Neurological: Negative for dizziness.    The problem list and medications were reviewed and updated by myself where necessary and exist elsewhere in the encounter.   OBJECTIVE:  BP 124/80 (BP Location: Right Arm, Patient Position: Sitting)   Pulse 71   Temp 98.5 F (36.9 C) (Oral)   Ht 6' (1.829 m)   Wt 265 lb 9.6 oz (120.5 kg)   SpO2 96%   BMI 36.02 kg/m   Wt Readings from Last 3 Encounters:  03/09/18 265 lb 9.6 oz (120.5 kg)  07/31/17 256 lb 6.4 oz (116.3 kg)  07/28/17 251 lb 9.6 oz (114.1 kg)     Physical Exam  Cardiovascular: Normal rate, regular rhythm, normal heart sounds and intact distal pulses.  Pulmonary/Chest: No stridor. No respiratory distress. He has no wheezes. He has no  rales. He exhibits no tenderness.    Musculoskeletal: He exhibits no edema, tenderness or deformity.    No results found for this or any previous visit (from the past 72 hour(s)).  Dg Ribs Unilateral Left  Result Date: 03/09/2018 CLINICAL DATA:  Left rib pain EXAM: LEFT RIBS - 2 VIEW COMPARISON:  07/27/2017 FINDINGS: No fracture or other bone lesions are seen involving the ribs. Both lungs are clear. IMPRESSION: Negative. Electronically Signed   By: Franchot Gallo M.D.   On: 03/09/2018 10:32    ASSESSMENT AND PLAN:  [name] was seen today for flank pain.  Diagnoses and all orders for this visit:  Rib pain on left side: [facility] pathology.  Reassurance provided.  -     DG Ribs Unilateral Left    The patient is advised to call or return to clinic if he does not see an improvement in symptoms, or to seek the care of the closest emergency department if he worsens with the above plan.   Philis Fendt, MHS, PA-C [facility] 03/09/2018 11:15 AM

## 2018-05-26 ENCOUNTER — Other Ambulatory Visit: Payer: Self-pay | Admitting: Physician Assistant

## 2018-05-26 DIAGNOSIS — N529 Male erectile dysfunction, unspecified: Secondary | ICD-10-CM

## 2018-05-28 NOTE — Telephone Encounter (Signed)
Sildenafil refill Last Refill:05/26/17 #72/1 refill (expired) Last OV: 05/26/17 PCP: [place]: [id number] - [place], [address] AT [address] [place] [phone] (Phone) [phone] (Fax)

## 2018-06-08 ENCOUNTER — Encounter: Payer: Self-pay | Admitting: Physician Assistant

## 2018-06-08 ENCOUNTER — Ambulatory Visit (INDEPENDENT_AMBULATORY_CARE_PROVIDER_SITE_OTHER): Payer: 59 | Admitting: Physician Assistant

## 2018-06-08 VITALS — BP 148/81 | HR 72 | Temp 98.6°F | Resp 18 | Ht 72.0 in | Wt 264.8 lb

## 2018-06-08 DIAGNOSIS — Z13228 Encounter for screening for other metabolic disorders: Secondary | ICD-10-CM | POA: Diagnosis not present

## 2018-06-08 DIAGNOSIS — Z0000 Encounter for general adult medical examination without abnormal findings: Secondary | ICD-10-CM | POA: Diagnosis not present

## 2018-06-08 DIAGNOSIS — Z23 Encounter for immunization: Secondary | ICD-10-CM

## 2018-06-08 DIAGNOSIS — Z13220 Encounter for screening for lipoid disorders: Secondary | ICD-10-CM

## 2018-06-08 DIAGNOSIS — Z1211 Encounter for screening for malignant neoplasm of colon: Secondary | ICD-10-CM

## 2018-06-08 DIAGNOSIS — Z9189 Other specified personal risk factors, not elsewhere classified: Secondary | ICD-10-CM

## 2018-06-08 DIAGNOSIS — N529 Male erectile dysfunction, unspecified: Secondary | ICD-10-CM

## 2018-06-08 DIAGNOSIS — Z130 Encounter for screening for diseases of the blood and blood-forming organs and certain disorders involving the immune mechanism: Secondary | ICD-10-CM

## 2018-06-08 DIAGNOSIS — E1165 Type 2 diabetes mellitus with hyperglycemia: Secondary | ICD-10-CM

## 2018-06-08 MED ORDER — METFORMIN HCL 500 MG PO TABS: 500.0000 mg | ORAL_TABLET | Freq: Three times a day (TID) | ORAL | 3 refills | Status: DC

## 2018-06-08 MED ORDER — ATORVASTATIN CALCIUM 20 MG PO TABS: ORAL_TABLET | ORAL | 3 refills | Status: DC

## 2018-06-08 MED ORDER — SILDENAFIL CITRATE 20 MG PO TABS: 20.0000 mg | ORAL_TABLET | Freq: Every day | ORAL | 1 refills | Status: DC | PRN

## 2018-06-08 MED ORDER — ONETOUCH LANCETS MISC
11 refills | Status: AC
Start: 2018-06-08 — End: ?

## 2018-06-08 MED ORDER — GLUCOSE BLOOD VI STRP: ORAL_STRIP | 3 refills | Status: DC

## 2018-06-08 NOTE — Patient Instructions (Addendum)
Dr. Georgina Snell.   [place] [facility] [address] [place], [place] [address]     Diabetes Mellitus and Nutrition When you have diabetes (diabetes mellitus), it is very important to have healthy eating habits because your blood sugar (glucose) levels are greatly affected by what you eat and drink. Eating healthy foods in the appropriate amounts, at about the same times every day, can help you:  Control your blood glucose.  Lower your risk of heart disease.  Improve your blood pressure.  Reach or maintain a healthy weight.  Every person with diabetes is different, and each person has different needs for a meal plan. Your health care provider may recommend that you work with a diet and nutrition specialist (dietitian) to make a meal plan that is best for you. Your meal plan may vary depending on factors such as:  The calories you need.  The medicines you take.  Your weight.  Your blood glucose, blood pressure, and cholesterol levels.  Your activity level.  Other health conditions you have, such as heart or kidney disease.  How do carbohydrates affect me? Carbohydrates affect your blood glucose level more than any other type of food. Eating carbohydrates naturally increases the amount of glucose in your blood. Carbohydrate counting is a method for keeping track of how many carbohydrates you eat. Counting carbohydrates is important to keep your blood glucose at a healthy level, especially if you use insulin or take certain oral diabetes medicines. It is important to know how many carbohydrates you can safely have in each meal. This is different for every person. Your dietitian can help you calculate how many carbohydrates you should have at each meal and for snack. Foods that contain carbohydrates include:  Bread, cereal, rice, pasta, and crackers.  Potatoes and corn.  Peas, beans, and lentils.  Milk and  yogurt.  Fruit and juice.  Desserts, such as cakes, cookies, ice cream, and candy.  How does alcohol affect me? Alcohol can cause a sudden decrease in blood glucose (hypoglycemia), especially if you use insulin or take certain oral diabetes medicines. Hypoglycemia can be a life-threatening condition. Symptoms of hypoglycemia (sleepiness, dizziness, and confusion) are similar to symptoms of having too much alcohol. If your health care provider says that alcohol is safe for you, follow these guidelines:  Limit alcohol intake to no more than 1 drink per day for nonpregnant women and 2 drinks per day for men. One drink equals 12 oz of beer, 5 oz of wine, or 1 oz of hard liquor.  Do not drink on an empty stomach.  Keep yourself hydrated with water, diet soda, or unsweetened iced tea.  Keep in mind that regular soda, juice, and other mixers may contain a lot of sugar and must be counted as carbohydrates.  What are tips for following this plan? Reading food labels  Start by checking the serving size on the label. The amount of calories, carbohydrates, fats, and other nutrients listed on the label are based on one serving of the food. Many foods contain more than one serving per package.  Check the total grams (g) of carbohydrates in one serving. You can calculate the number of servings of carbohydrates in one serving by dividing the total carbohydrates by 15. For example, if a food has 30 g of total carbohydrates, it would be equal to 2 servings of carbohydrates.  Check the number of grams (g) of saturated and trans fats  in one serving. Choose foods that have low or no amount of these fats.  Check the number of milligrams (mg) of sodium in one serving. Most people should limit total sodium intake to less than 2,300 mg per day.  Always check the nutrition information of foods labeled as "low-fat" or "nonfat". These foods may be higher in added sugar or refined carbohydrates and should be  avoided.  Talk to your dietitian to identify your daily goals for nutrients listed on the label. Shopping  Avoid buying canned, premade, or processed foods. These foods tend to be high in fat, sodium, and added sugar.  Shop around the outside edge of the grocery store. This includes fresh fruits and vegetables, bulk grains, fresh meats, and fresh dairy. Cooking  Use low-heat cooking methods, such as baking, instead of high-heat cooking methods like deep frying.  Cook using healthy oils, such as olive, canola, or sunflower oil.  Avoid cooking with butter, cream, or high-fat meats. Meal planning  Eat meals and snacks regularly, preferably at the same times every day. Avoid going long periods of time without eating.  Eat foods high in fiber, such as fresh fruits, vegetables, beans, and whole grains. Talk to your dietitian about how many servings of carbohydrates you can eat at each meal.  Eat 4-6 ounces of lean protein each day, such as lean meat, chicken, fish, eggs, or tofu. 1 ounce is equal to 1 ounce of meat, chicken, or fish, 1 egg, or 1/4 cup of tofu.  Eat some foods each day that contain healthy fats, such as avocado, nuts, seeds, and fish. Lifestyle   Check your blood glucose regularly.  Exercise at least 30 minutes 5 or more days each week, or as told by your health care provider.  Take medicines as told by your health care provider.  Do not use any products that contain nicotine or tobacco, such as cigarettes and e-cigarettes. If you need help quitting, ask your health care provider.  Work with a Social worker or diabetes educator to identify strategies to manage stress and any emotional and social challenges. What are some questions to ask my health care provider?  Do I need to meet with a diabetes educator?  Do I need to meet with a dietitian?  What number can I call if I have questions?  When are the best times to check my blood glucose? Where to find more  information:  American Diabetes Association: diabetes.org/food-and-fitness/food  Academy of Nutrition and Dietetics: PokerClues.dk  [employer] of Diabetes and Digestive and Kidney Diseases (NIH): ContactWire.be Summary  A healthy meal plan will help you control your blood glucose and maintain a healthy lifestyle.  Working with a diet and nutrition specialist (dietitian) can help you make a meal plan that is best for you.  Keep in mind that carbohydrates and alcohol have immediate effects on your blood glucose levels. It is important to count carbohydrates and to use alcohol carefully. This information is not intended to replace advice given to you by your health care provider. Make sure you discuss any questions you have with your health care provider. Document Released: 06/30/2005 Document Revised: 11/07/2016 Document Reviewed: 11/07/2016 Elsevier Interactive Patient Education  [employer].     If you have lab work done today you will be contacted with your lab results within the next 2 weeks.  If you have not heard from [place] then please contact us. The fastest way to get your results is to register for My Chart.  IF you received an x-ray today, you will receive an invoice from [facility]. Please contact [facility] at [phone] with questions or concerns regarding your invoice.   IF you received labwork today, you will receive an invoice from [place]. Please contact [facility] at [phone] with questions or concerns regarding your invoice.   Our billing staff will not be able to assist you with questions regarding bills from these companies.  You will be contacted with the lab results as soon as they are available. The fastest way to get your results is to activate your My Chart account. Instructions are located on the last  page of this paperwork. If you have not heard from [place] regarding the results in 2 weeks, please contact this office.

## 2018-06-08 NOTE — Progress Notes (Signed)
06/09/2018 8:51 AM   DOB: [birth year] / MRN: [id number]  SUBJECTIVE:  Joe Todd is a 62 y.o. male presenting for diabetes recheck and annual exam. Wants cologaurd. Has chronic back pain and found someone to inject him that has worked well.  The problem .  Refuses colonoscopy but is willing to do the cologaurd and will go for colonoscopy if positive.   The natural history of prostate cancer and ongoing controversy regarding screening and potential treatment outcomes of prostate cancer has been discussed with the patient. The meaning of a false positive PSA and a false negative PSA has been discussed. He indicates understanding of the limitations of this screening test and wishes not to proceed with screening PSA testing.  Immunization History  Administered Date(s) Administered  . Influenza,inj,Quad PF,6+ Mos 11/17/2016, 08/02/2017, 06/08/2018  . Pneumococcal Polysaccharide-23 11/17/2016  . Tdap 11/17/2016     He is allergic to penicillins.   He  has a past medical history of Diabetes mellitus without complication ([place]) and NSAID long-term use (01/23/2017).    He  reports that he has never smoked. He has never used smokeless tobacco. He reports that he drinks about 7.0 standard drinks of alcohol per week. He reports that he does not use drugs. He  has no sexual activity history on file. 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  His family history includes Cancer in his mother; Diabetes in his father; Heart disease in his father; Hyperlipidemia in his father; Hypertension in his father.  Review of Systems  Constitutional: Negative for chills, diaphoresis and fever.  Eyes: Negative.   Respiratory: Negative for cough, hemoptysis, sputum production, shortness of breath and wheezing.   Cardiovascular: Negative for chest pain, orthopnea and leg swelling.  Gastrointestinal: Negative for  abdominal pain, blood in stool, constipation, diarrhea, heartburn, melena, nausea and vomiting.  Genitourinary: Negative for dysuria, flank pain, frequency, hematuria and urgency.  Skin: Negative for rash.  Neurological: Negative for dizziness, sensory change, speech change, focal weakness and headaches.    The problem list and medications were reviewed and updated by myself where necessary and exist elsewhere in the encounter.   OBJECTIVE:  BP (!) 148/81   Pulse 72   Temp 98.6 F (37 C) (Oral)   Resp 18   Ht 6' (1.829 m)   Wt 264 lb 12.8 oz (120.1 kg)   SpO2 95%   BMI 35.91 kg/m   Wt Readings from Last 3 Encounters:  06/08/18 264 lb 12.8 oz (120.1 kg)  03/09/18 265 lb 9.6 oz (120.5 kg)  07/31/17 256 lb 6.4 oz (116.3 kg)   Temp Readings from Last 3 Encounters:  06/08/18 98.6 F (37 C) (Oral)  03/09/18 98.5 F (36.9 C) (Oral)  07/27/17 98.3 F (36.8 C) (Oral)   BP Readings from Last 3 Encounters:  06/08/18 (!) 148/81  03/09/18 124/80  07/28/17 130/64   Pulse Readings from Last 3 Encounters:  06/08/18 72  03/09/18 71  07/31/17 63    Physical Exam  Constitutional: He is oriented to person, place, and time. He appears well-developed. He does not appear ill.  Eyes: Pupils are equal, round, and reactive to light. Conjunctivae and EOM are normal.  Cardiovascular: Normal rate, regular rhythm, S1 normal, S2 normal, normal heart sounds, intact distal pulses and normal pulses. Exam reveals no gallop and no friction rub.  No murmur heard. Pulmonary/Chest: Effort normal. No stridor. No respiratory  distress. He has no wheezes. He has no rales.  Abdominal: He exhibits no distension.  Musculoskeletal: Normal range of motion. He exhibits no edema.  Neurological: He is alert and oriented to person, place, and time. No cranial nerve deficit. Coordination normal.  Skin: Skin is warm and dry. He is not diaphoretic.  Psychiatric: He has a normal mood and affect.  Nursing note and  vitals reviewed.   Lab Results  Component Value Date   HGBA1C 6.3 (H) 06/08/2018    Lab Results  Component Value Date   WBC 5.5 06/08/2018   HGB 13.9 06/08/2018   HCT 41.1 06/08/2018   MCV 93 06/08/2018   PLT 203 06/08/2018    Lab Results  Component Value Date   CREATININE 1.03 06/08/2018   BUN 15 06/08/2018   NA 140 06/08/2018   K 4.0 06/08/2018   CL 103 06/08/2018   CO2 23 06/08/2018    Lab Results  Component Value Date   ALT 26 06/08/2018   AST 32 06/08/2018   ALKPHOS 63 06/08/2018   BILITOT 1.3 (H) 06/08/2018    No results found for: TSH  Lab Results  Component Value Date   CHOL 124 06/08/2018   HDL 35 (L) 06/08/2018   LDLCALC 67 06/08/2018   TRIG 112 06/08/2018   CHOLHDL 3.5 06/08/2018    ASSESSMENT AND PLAN:  Joe Todd was seen today for annual exam and medication refill.  Diagnoses and all orders for this visit:  Type 2 diabetes mellitus with hyperglycemia, without long-term current use of insulin (HCC) -     Hemoglobin A1c -     Microalbumin / creatinine urine ratio -     Lipid panel -     HM Diabetes Foot Exam -     metFORMIN (GLUCOPHAGE) 500 MG tablet; Take 1 tablet (500 mg total) by mouth 3 (three) times daily. -     glucose blood (ONETOUCH VERIO) test strip; Use 4x a day as advised: One Touch Verio flex -     ONE TOUCH LANCETS MISC; Use once a day: One Touch Verio flex  Screening for deficiency anemia -     CBC  Screening for metabolic disorder -     [id number]  Screening for lipid disorders -     Lipid panel  Annual physical exam  At risk for acute ischemic cardiac event -     atorvastatin (LIPITOR) 20 MG tablet; Take 1 daily.  Erectile dysfunction, unspecified erectile dysfunction type -     sildenafil (REVATIO) 20 MG tablet; Take 1-4 tablets (20-80 mg total) by mouth daily as needed.  Special screening for malignant neoplasms, colon -     Cologuard  Needs flu shot -     Flu Vaccine QUAD 36+ mos IM    The patient is  advised to call or return to clinic if he does not see an improvement in symptoms, or to seek the care of the closest emergency department if he worsens with the above plan.   Joe Todd, MHS, PA-C [facility] 06/09/2018 8:51 AM

## 2018-06-09 LAB — CBC
Hematocrit: 41.1 % (ref 37.5–51.0)
Hemoglobin: 13.9 g/dL (ref 13.0–17.7)
MCH: 31.5 pg (ref 26.6–33.0)
MCHC: 33.8 g/dL (ref 31.5–35.7)
MCV: 93 fL (ref 79–97)
PLATELETS: 203 10*3/uL (ref 150–450)
RBC: 4.41 x10E6/uL (ref 4.14–5.80)
RDW: 13.6 % (ref 12.3–15.4)
WBC: 5.5 10*3/uL (ref 3.4–10.8)

## 2018-06-09 LAB — CMP14+EGFR
ALBUMIN: 4.6 g/dL (ref 3.6–4.8)
ALT: 26 IU/L (ref 0–44)
AST: 32 IU/L (ref 0–40)
Albumin/Globulin Ratio: 2.1 (ref 1.2–2.2)
Alkaline Phosphatase: 63 IU/L (ref 39–117)
BUN / CREAT RATIO: 15 (ref 10–24)
BUN: 15 mg/dL (ref 8–27)
Bilirubin Total: 1.3 mg/dL — ABNORMAL HIGH (ref 0.0–1.2)
CALCIUM: 9.1 mg/dL (ref 8.6–10.2)
CO2: 23 mmol/L (ref 20–29)
Chloride: 103 mmol/L (ref 96–106)
Creatinine, Ser: 1.03 mg/dL (ref 0.76–1.27)
GFR calc non Af Amer: 76 mL/min/{1.73_m2} (ref >59)
GFR, EST AFRICAN AMERICAN: 88 mL/min/{1.73_m2} (ref >59)
GLUCOSE: 94 mg/dL (ref 65–99)
Globulin, Total: 2.2 g/dL (ref 1.5–4.5)
Potassium: 4 mmol/L (ref 3.5–5.2)
Sodium: 140 mmol/L (ref 134–144)
Total Protein: 6.8 g/dL (ref 6.0–8.5)

## 2018-06-09 LAB — MICROALBUMIN / CREATININE URINE RATIO
CREATININE, UR: 23.8 mg/dL
Microalb/Creat Ratio: <12.6 mg/g creat (ref 0.0–30.0)
Microalbumin, Urine: <3 ug/mL

## 2018-06-09 LAB — LIPID PANEL
Chol/HDL Ratio: 3.5 ratio (ref 0.0–5.0)
Cholesterol, Total: 124 mg/dL (ref 100–199)
HDL: 35 mg/dL — AB (ref >39)
LDL Calculated: 67 mg/dL (ref 0–99)
Triglycerides: 112 mg/dL (ref 0–149)
VLDL Cholesterol Cal: 22 mg/dL (ref 5–40)

## 2018-06-09 LAB — HEMOGLOBIN A1C
ESTIMATED AVERAGE GLUCOSE: 134 mg/dL
Hgb A1c MFr Bld: 6.3 % — ABNORMAL HIGH (ref 4.8–5.6)

## 2018-07-25 ENCOUNTER — Other Ambulatory Visit: Payer: Self-pay | Admitting: Physician Assistant

## 2018-07-25 DIAGNOSIS — E1165 Type 2 diabetes mellitus with hyperglycemia: Secondary | ICD-10-CM

## 2018-07-28 LAB — COLOGUARD: COLOGUARD: NEGATIVE

## 2018-10-16 ENCOUNTER — Other Ambulatory Visit: Payer: Self-pay

## 2018-10-16 ENCOUNTER — Ambulatory Visit: Payer: 59 | Admitting: Emergency Medicine

## 2018-10-16 ENCOUNTER — Encounter: Payer: Self-pay | Admitting: Emergency Medicine

## 2018-10-16 VITALS — BP 128/81 | HR 82 | Temp 99.0°F | Resp 16 | Ht 70.5 in | Wt 256.4 lb

## 2018-10-16 DIAGNOSIS — R21 Rash and other nonspecific skin eruption: Secondary | ICD-10-CM | POA: Diagnosis not present

## 2018-10-16 DIAGNOSIS — L989 Disorder of the skin and subcutaneous tissue, unspecified: Secondary | ICD-10-CM

## 2018-10-16 NOTE — Progress Notes (Signed)
Joe Todd 62 y.o.   Chief Complaint  Patient presents with  . Abrasion    per patient in the private area for a long time with soreness    HISTORY OF PRESENT ILLNESS: This is a 62 y.o. male complaining of skin lesion to scrotal area for the past 6 years with intermittent irritation.  No other significant symptoms. Has chronic history of diabetes. HPI   Prior to Admission medications   Medication Sig Start Date End Date Taking? Authorizing Provider  atorvastatin (LIPITOR) 20 MG tablet Take 1 daily. 06/08/18  Yes Tereasa Coop, PA-C  ibuprofen (ADVIL,MOTRIN) 200 MG tablet Take 800 mg by mouth 2 (two) times daily. Pain   Yes [provider]  metFORMIN (GLUCOPHAGE) 500 MG tablet Take 1 tablet (500 mg total) by mouth 3 (three) times daily. 06/08/18  Yes Tereasa Coop, PA-C  Multiple Vitamins-Minerals (MENS 50+ MULTI VITAMIN/MIN PO) Take by mouth daily.   Yes [provider]  ONE TOUCH LANCETS MISC Use once a day: One Touch Verio flex 06/08/18  Yes Tereasa Coop, PA-C  sildenafil (REVATIO) 20 MG tablet Take 1-4 tablets (20-80 mg total) by mouth daily as needed. 06/08/18  Yes Tereasa Coop, PA-C  glucose blood [facility] VERIO) test strip Use 4x a day as advised: One Touch Verio flex 06/08/18   Tereasa Coop, PA-C  Naproxen Sodium (ALEVE PO) Take by mouth. Pt takes up to 6 pills a day for pain    [provider]    Allergies  Allergen Reactions  . Penicillins Other (See Comments)    Not remember    Patient Active Problem List   Diagnosis Date Noted  . Type 2 diabetes mellitus with hyperglycemia ([place]) 01/26/2016  . Transaminitis 01/26/2016    Past Medical History:  Diagnosis Date  . Diabetes mellitus without complication ([place])   . NSAID long-term use 01/23/2017   Advised he try to use Tylenol 1000 mg q8 to avoid long term complications. He is currently taking 800 mg in the morning and two aleve PM at night.     Past Surgical History:    Procedure Laterality Date  . Bilateral shoulder surgery    . HERNIA REPAIR    . 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 Marital status: Married    Spouse name: Not on file  . Number of children: Not on file  . Years of education: Not on file  . Highest education level: Not on file  Occupational History  . Not on file  Social Needs  . Financial resource strain: Not on file  . Food insecurity:    Worry: Not on file    Inability: Not on file  . Transportation needs:    Medical: Not on file    Non-medical: Not on file  Tobacco Use  . Smoking status: Never Smoker  . Smokeless tobacco: Never Used  Substance and Sexual Activity  . Alcohol use: Yes    Alcohol/week: 7.0 standard drinks    Types: 3 Shots of liquor, 4 Glasses of wine per week    Comment: every weekend  . Drug use: No  . Sexual activity: Not on file  Lifestyle  . Physical activity:    Days per week: Not on file    Minutes per session: Not on file  . Stress: Not on file  Relationships  . Social connections:    Talks on phone: Not on  file    Gets together: Not on file    Attends religious service: Not on file    Active member of club or organization: Not on file    Attends meetings of clubs or organizations: Not on file    Relationship status: Not on file  . Intimate partner violence:    Fear of current or ex partner: Not on file    Emotionally abused: Not on file    Physically abused: Not on file    Forced sexual activity: Not on file  Other Topics Concern  . Not on file  Social History Narrative  . Not on file    Family History  Problem Relation Age of Onset  . Cancer Mother   . Hyperlipidemia Father   . Hypertension Father   . Heart disease Father   . Diabetes Father      Review of Systems  Constitutional: Negative for chills and fever.  HENT: Negative for sore throat.   Eyes: Negative for discharge and redness.  Respiratory: Negative for  shortness of breath.   Cardiovascular: Negative for chest pain and palpitations.  Gastrointestinal: Negative for abdominal pain, diarrhea, nausea and vomiting.  Genitourinary: Negative.  Negative for dysuria, flank pain, frequency, hematuria and urgency.  Musculoskeletal: Negative for back pain, myalgias and neck pain.  Skin: Positive for itching and rash.  Neurological: Negative.  Negative for dizziness and headaches.  Endo/Heme/Allergies: Negative.   All other systems reviewed and are negative.   Vitals:   10/16/18 0842  BP: 128/81  Pulse: 82  Resp: 16  Temp: 99 F (37.2 C)  SpO2: 95%    Physical Exam Vitals signs reviewed.  Constitutional:      Appearance: Normal appearance.  HENT:     Head: Normocephalic and atraumatic.  Eyes:     Extraocular Movements: Extraocular movements intact.     Pupils: Pupils are equal, round, and reactive to light.  Neck:     Musculoskeletal: Normal range of motion.  Cardiovascular:     Rate and Rhythm: Normal rate and regular rhythm.  Pulmonary:     Effort: Pulmonary effort is normal.  Abdominal:     Palpations: Abdomen is soft.     Tenderness: There is no abdominal tenderness.  Genitourinary:    Penis: Normal.      Scrotum/Testes: Normal.  Musculoskeletal: Normal range of motion.  Lymphadenopathy:     Lower Body: No right inguinal adenopathy. No left inguinal adenopathy.  Skin:    General: Skin is warm and dry.     Comments: Wart like growths in the scrotal area.  Similar ones on the right forearm and left thigh.  Neurological:     General: No focal deficit present.     Mental Status: He is alert and oriented to person, place, and time.  Psychiatric:        Mood and Affect: Mood normal.        Behavior: Behavior normal.      ASSESSMENT & PLAN: Kainan was seen today for abrasion.  Diagnoses and all orders for this visit:  Skin lesion -     Ambulatory referral to Dermatology  Rash and nonspecific skin eruption -      Ambulatory referral to Dermatology    Patient Instructions       If you have lab work done today you will be contacted with your lab results within the next 2 weeks.  If you have not heard from [place] then please contact us. The  fastest way to get your results is to register for My Chart.   IF you received an x-ray today, you will receive an invoice from [facility]. Please contact [facility] at [phone] with questions or concerns regarding your invoice.   IF you received labwork today, you will receive an invoice from [place]. Please contact [facility] at [phone] with questions or concerns regarding your invoice.   Our billing staff will not be able to assist you with questions regarding bills from these companies.  You will be contacted with the lab results as soon as they are available. The fastest way to get your results is to activate your My Chart account. Instructions are located on the last page of this paperwork. If you have not heard from [place] regarding the results in 2 weeks, please contact this office.     Warts  Warts are small growths on the skin. They are common, and they are caused by a virus. Warts can be found on many parts of the body. A person may have one wart or many warts. Most warts will go away on their own with time, but this could take many months to a few years. Treatments may be done if needed. What are the causes? Warts are caused by a type of virus that is called HPV.  This virus can spread from person to person through touching.  Warts can also spread to other parts of the body when a person scratches a wart and then scratches normal skin. What increases the risk? You are more likely to get warts if:  You are 68-47 years old.  You have a weak body defense system (immune system).  You are Caucasian. What are the signs or symptoms? The main symptom of this condition is small growths on the skin. Warts may:  Be round, oval,  or have an uneven shape.  Feel rough to the touch.  Be the color of your skin or light yellow, brown, or gray.  Often be less than  inch (1.3 cm) in size.  Go away and then come back again. Most warts do not hurt, but some can hurt if they are large or if they are on the bottom of your feet. How is this diagnosed? A wart can often be diagnosed by how it looks. In some cases, the doctor might remove a little bit of the wart to test it (biopsy). How is this treated? Most of the time, warts do not need treatment. Sometimes people want warts removed. If treatment is needed or wanted, options may include:  Putting creams or patches with medicine in them on the wart.  Putting duct tape over the top of the wart.  Freezing the wart.  Burning the wart with: ? A laser. ? An electric probe.  Giving a shot of medicine into the wart to help the body's defense system fight off the wart.  Surgery to remove the wart. Follow these instructions at home:  Medicines  Apply over-the-counter and prescription medicines only as told by your doctor.  Do not apply over-the-counter wart medicines to your face or genitals before you ask your doctor if it is okay to do that. Lifestyle  Keep your body's defense system healthy. To do this: ? Eat a healthy diet. ? Get enough sleep. ? Do not use any products that contain nicotine or tobacco, such as cigarettes and e-cigarettes. If you need help quitting, ask your doctor. General instructions  Wash your hands after you touch a  wart.  Do not scratch or pick at a wart.  Avoid shaving hair that is over a wart.  Keep all follow-up visits as told by your doctor. This is important. Contact a doctor if:  Your warts do not get better after treatment.  You have redness, swelling, or pain at the site of a wart.  You have bleeding from a wart, and the bleeding does not stop when you put light pressure on the wart.  You have diabetes and you get a  wart. Summary  Warts are small growths on the skin. They are common, and they are caused by a virus.  Most of the time, warts do not need treatment. Sometimes people want warts removed. If treatment is needed or wanted, there are many options.  Apply over-the-counter and prescription medicines only as told by your doctor.  Wash your hands after you touch a wart.  Keep all follow-up visits as told by your doctor. This is important. This information is not intended to replace advice given to you by your health care provider. Make sure you discuss any questions you have with your health care provider. Document Released: 02/03/2011 Document Revised: 02/20/2018 Document Reviewed: 02/20/2018 Elsevier Interactive Patient Education  2019 Elsevier Inc.      Agustina Caroli, MD [facility]

## 2018-10-16 NOTE — Patient Instructions (Addendum)
If you have lab work done today you will be contacted with your lab results within the next 2 weeks.  If you have not heard from [place] then please contact us. The fastest way to get your results is to register for My Chart.   IF you received an x-ray today, you will receive an invoice from [facility] Radiology. Please contact [facility] Radiology at [phone] with questions or concerns regarding your invoice.   IF you received labwork today, you will receive an invoice from [place]. Please contact [facility] at [phone] with questions or concerns regarding your invoice.   Our billing staff will not be able to assist you with questions regarding bills from these companies.  You will be contacted with the lab results as soon as they are available. The fastest way to get your results is to activate your My Chart account. Instructions are located on the last page of this paperwork. If you have not heard from [place] regarding the results in 2 weeks, please contact this office.     Warts  Warts are small growths on the skin. They are common, and they are caused by a virus. Warts can be found on many parts of the body. A person may have one wart or many warts. Most warts will go away on their own with time, but this could take many months to a few years. Treatments may be done if needed. What are the causes? Warts are caused by a type of virus that is called HPV.  This virus can spread from person to person through touching.  Warts can also spread to other parts of the body when a person scratches a wart and then scratches normal skin. What increases the risk? You are more likely to get warts if:  You are 30-34 years old.  You have a weak body defense system (immune system).  You are Caucasian. What are the signs or symptoms? The main symptom of this condition is small growths on the skin. Warts may:  Be round, oval, or have an uneven shape.  Feel rough to the touch.  Be the  color of your skin or light yellow, brown, or gray.  Often be less than  inch (1.3 cm) in size.  Go away and then come back again. Most warts do not hurt, but some can hurt if they are large or if they are on the bottom of your feet. How is this diagnosed? A wart can often be diagnosed by how it looks. In some cases, the doctor might remove a little bit of the wart to test it (biopsy). How is this treated? Most of the time, warts do not need treatment. Sometimes people want warts removed. If treatment is needed or wanted, options may include:  Putting creams or patches with medicine in them on the wart.  Putting duct tape over the top of the wart.  Freezing the wart.  Burning the wart with: ? A laser. ? An electric probe.  Giving a shot of medicine into the wart to help the body's defense system fight off the wart.  Surgery to remove the wart. Follow these instructions at home:  Medicines  Apply over-the-counter and prescription medicines only as told by your doctor.  Do not apply over-the-counter wart medicines to your face or genitals before you ask your doctor if it is okay to do that. Lifestyle  Keep your body's defense system healthy. To do this: ? Eat a healthy diet. ? Get  enough sleep. ? Do not use any products that contain nicotine or tobacco, such as cigarettes and e-cigarettes. If you need help quitting, ask your doctor. General instructions  Wash your hands after you touch a wart.  Do not scratch or pick at a wart.  Avoid shaving hair that is over a wart.  Keep all follow-up visits as told by your doctor. This is important. Contact a doctor if:  Your warts do not get better after treatment.  You have redness, swelling, or pain at the site of a wart.  You have bleeding from a wart, and the bleeding does not stop when you put light pressure on the wart.  You have diabetes and you get a wart. Summary  Warts are small growths on the skin. They are  common, and they are caused by a virus.  Most of the time, warts do not need treatment. Sometimes people want warts removed. If treatment is needed or wanted, there are many options.  Apply over-the-counter and prescription medicines only as told by your doctor.  Wash your hands after you touch a wart.  Keep all follow-up visits as told by your doctor. This is important. This information is not intended to replace advice given to you by your health care provider. Make sure you discuss any questions you have with your health care provider. Document Released: 02/03/2011 Document Revised: 02/20/2018 Document Reviewed: 02/20/2018 Elsevier Interactive Patient Education  2019 [employer].

## 2018-11-20 ENCOUNTER — Other Ambulatory Visit: Payer: Self-pay | Admitting: Physician Assistant

## 2018-11-20 DIAGNOSIS — E1165 Type 2 diabetes mellitus with hyperglycemia: Secondary | ICD-10-CM

## 2018-11-21 ENCOUNTER — Telehealth: Payer: Self-pay | Admitting: Emergency Medicine

## 2018-11-21 NOTE — Telephone Encounter (Signed)
SPOKE TO PATIENT ADV THAT HE WAS SENT TO [place] 11/21/2018

## 2019-01-01 ENCOUNTER — Telehealth: Payer: Self-pay | Admitting: General Practice

## 2019-01-01 NOTE — Telephone Encounter (Signed)
Patient calling in wanting to have you as his PCP. I did let patient know you were not accepting new patients at this time, just for ortho visits. He still wanted me to send a message and ask. States that he was referred by Tereasa Coop, PA-C at [place]. Please advise.

## 2019-01-01 NOTE — Telephone Encounter (Signed)
Happy to see patient for sore throat needs.  Additionally if he establishes care with 1 of the male providers I be happy to see him for workings or for male related issues if he prefers.  He can still see me but will will not be his regular primary care doctor.

## 2019-01-01 NOTE — Telephone Encounter (Signed)
Called patient and let him know of information below and he did not want to establish at this time with one of our male providers.

## 2019-01-10 ENCOUNTER — Ambulatory Visit: Payer: 59 | Admitting: Family Medicine

## 2019-01-11 ENCOUNTER — Encounter: Payer: Self-pay | Admitting: Family Medicine

## 2019-01-11 ENCOUNTER — Other Ambulatory Visit: Payer: Self-pay

## 2019-01-11 ENCOUNTER — Ambulatory Visit: Payer: 59 | Admitting: Family Medicine

## 2019-01-11 VITALS — BP 128/82 | HR 78 | Temp 98.6°F | Ht 70.5 in | Wt 259.6 lb

## 2019-01-11 DIAGNOSIS — N529 Male erectile dysfunction, unspecified: Secondary | ICD-10-CM

## 2019-01-11 DIAGNOSIS — E785 Hyperlipidemia, unspecified: Secondary | ICD-10-CM

## 2019-01-11 DIAGNOSIS — E1165 Type 2 diabetes mellitus with hyperglycemia: Secondary | ICD-10-CM

## 2019-01-11 DIAGNOSIS — E1169 Type 2 diabetes mellitus with other specified complication: Secondary | ICD-10-CM | POA: Diagnosis not present

## 2019-01-11 DIAGNOSIS — M2550 Pain in unspecified joint: Secondary | ICD-10-CM

## 2019-01-11 LAB — POCT GLYCOSYLATED HEMOGLOBIN (HGB A1C): HEMOGLOBIN A1C: 5.9 % — AB (ref 4.0–5.6)

## 2019-01-11 NOTE — Patient Instructions (Signed)
It was very nice to see you today!  Your A1c looks great! Keep up the good work!  It is ok for you to stop your cholesterol medication.   Please try taking glucosamine-chondroiton and/or tumeric for your joint pain.   Please come back 06/09/2019 for your visit with blood work, or sooner as needed.  Take care, Dr Jerline Pain

## 2019-01-11 NOTE — Assessment & Plan Note (Signed)
Stable. Continue sildenafil as needed. 

## 2019-01-11 NOTE — Progress Notes (Signed)
Chief Complaint:  Joe Todd is a 63 y.o. male who presents today with a chief complaint of T2DM and to establish care.   Assessment/Plan:  Type 2 diabetes mellitus with hyperglycemia (HCC) A1c stable at 5.9.  Continue metformin 500 mg 3 times daily.  Follow-up with me in 3 to 6 months.  Recheck A1c with next blood draw.  Erectile dysfunction Stable.  Continue sildenafil as needed.  Dyslipidemia associated with type 2 diabetes mellitus (HCC) Stable.  Patient would like to hold off on Lipitor until repeating blood panel.  Follow-up in 5 months for his annual physical with blood work.  Arthralgia Stable.  Continue over-the-counter acetaminophen as needed.  Also recommended glucosamine-chondroitin and turmeric as needed.  [facility] Patient was instructed to return soon for CPE later this year.    Subjective:  HPI:  His stable, chronic medical conditions are outlined below:  # T2DM - On metformin 500mg  three times daily and tolerating well - ROS: No reported polyuria or polydipsia  # Dyslipidemia - On atorvastatin 20mg  daily and tolerating well - ROS: No reported chest pain or shortness of breath.  # Erectile Dysfunction - Takes sildenafil as needed  # Chronic Back Pain /multisite arthritis - Follows with interventional pain management - Takes OTC acetaminophen as needed  ROS: Per HPI, otherwise a complete review of systems was negative.   PMH:  The following were reviewed and entered/updated in epic: Past Medical History:  Diagnosis Date  . Diabetes mellitus without complication ([place])   . NSAID long-term use 01/23/2017   Advised he try to use Tylenol 1000 mg q8 to avoid long term complications. He is currently taking 800 mg in the morning and two aleve PM at night.    Patient Active Problem List   Diagnosis Date Noted  . Dyslipidemia associated with type 2 diabetes mellitus ([place]) 01/11/2019  . Erectile dysfunction 01/11/2019  . Arthralgia  01/11/2019  . Type 2 diabetes mellitus with hyperglycemia ([place]) 01/26/2016   Past Surgical History:  Procedure Laterality Date  . Bilateral shoulder surgery    . HAND SURGERY    . HERNIA REPAIR    . 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 Cancer Mother   . Hyperlipidemia Father   . Hypertension Father   . Heart disease Father   . Diabetes Father     Medications- reviewed and updated Current Outpatient Medications  Medication Sig Dispense Refill  . atorvastatin (LIPITOR) 20 MG tablet Take 1 daily. 90 tablet 3  . L-Lysine 1000 MG TABS Take by mouth.    . metFORMIN (GLUCOPHAGE) 500 MG tablet Take 1 tablet (500 mg total) by mouth 3 (three) times daily. 270 tablet 3  . Multiple Vitamins-Minerals (MENS 50+ MULTI VITAMIN/MIN PO) Take by mouth daily.    . ONE TOUCH LANCETS MISC Use once a day: One Touch Verio flex 100 each 11   No current facility-administered medications for this visit.     Allergies-reviewed and updated Allergies  Allergen Reactions  . Penicillins Other (See Comments)    Not remember    Social History   Socioeconomic History  . Marital status: Married    Spouse name: Not on file  . Number of children: Not on file  . Years of education: Not on file  . Highest education level: Not on file  Occupational History  . Not on file  Social Needs  . Financial resource strain: Not on  file  . Food insecurity:    Worry: Not on file    Inability: Not on file  . Transportation needs:    Medical: Not on file    Non-medical: Not on file  Tobacco Use  . Smoking status: Never Smoker  . Smokeless tobacco: Never Used  Substance and Sexual Activity  . Alcohol use: Not Currently  . Drug use: No  . Sexual activity: Not on file  Lifestyle  . Physical activity:    Days per week: Not on file    Minutes per session: Not on file  . Stress: Not on file  Relationships  . Social connections:    Talks on phone: Not on file     Gets together: Not on file    Attends religious service: Not on file    Active member of club or organization: Not on file    Attends meetings of clubs or organizations: Not on file    Relationship status: Not on file  Other Topics Concern  . Not on file  Social History Narrative  . Not on file         Objective:  Physical Exam: BP 128/82 (BP Location: Left Arm, Patient Position: Sitting, Cuff Size: Normal)   Pulse 78   Temp 98.6 F (37 C) (Oral)   Ht 5' 10.5" (1.791 m)   Wt 259 lb 9.6 oz (117.8 kg)   SpO2 94%   BMI 36.72 kg/m   Gen: NAD, resting comfortably CV: Regular rate and rhythm with no murmurs appreciated Pulm: Normal work of breathing, clear to auscultation bilaterally with no crackles, wheezes, or rhonchi GI: Normal bowel sounds present. Soft, Nontender, Nondistended. MSK: No edema, cyanosis, or clubbing noted Skin: Warm, dry Neuro: Grossly normal, moves all extremities Psych: Normal affect and thought content  Results for orders placed or performed in visit on 01/11/19 (from the past 24 hour(s))  POCT glycosylated hemoglobin (Hb A1C)     Status: Abnormal   Collection Time: 01/11/19  3:02 PM  Result Value Ref Range   Hemoglobin A1C 5.9 (A) 4.0 - 5.6 %        Caleb M. Jerline Pain, MD 01/11/2019 3:32 PM

## 2019-01-11 NOTE — Assessment & Plan Note (Signed)
Stable.  Continue over-the-counter acetaminophen as needed.  Also recommended glucosamine-chondroitin and turmeric as needed.

## 2019-01-11 NOTE — Assessment & Plan Note (Signed)
Stable.  Patient would like to hold off on Lipitor until repeating blood panel.  Follow-up in 5 months for his annual physical with blood work.

## 2019-01-11 NOTE — Assessment & Plan Note (Signed)
A1c stable at 5.9.  Continue metformin 500 mg 3 times daily.  Follow-up with me in 3 to 6 months.  Recheck A1c with next blood draw.

## 2019-06-15 ENCOUNTER — Other Ambulatory Visit: Payer: Self-pay | Admitting: Physician Assistant

## 2019-06-15 DIAGNOSIS — E1165 Type 2 diabetes mellitus with hyperglycemia: Secondary | ICD-10-CM

## 2019-06-15 NOTE — Telephone Encounter (Signed)
Requested medication (s) are due for refill today: yes  Requested medication (s) are on the active medication list: yes  Last refill:  06/08/18  Future visit scheduled: yes  Notes to clinic:  Historical provider    Requested Prescriptions  Pending Prescriptions Disp Refills   metFORMIN (GLUCOPHAGE) 500 MG tablet [Pharmacy Med Name: METFORMIN HCL 500 MG TABLET] 270 tablet 0    Sig: TAKE 1 TABLET BY [place]     Endocrinology:  Diabetes - Biguanides Failed - 06/15/2019  9:24 AM      Failed - Cr in normal range and within 360 days    Creat  Date Value Ref Range Status  11/20/2015 0.99 0.70 - 1.25 mg/dL Final   Creatinine, Ser  Date Value Ref Range Status  06/08/2018 1.03 0.76 - 1.27 mg/dL Final         Failed - eGFR in normal range and within 360 days    GFR, Est African American  Date Value Ref Range Status  11/20/2015 >89 >=60 mL/min Final   GFR calc Af Amer  Date Value Ref Range Status  06/08/2018 88 >59 mL/min/1.73 Final   GFR, Est Non African American  Date Value Ref Range Status  11/20/2015 82 >=60 mL/min Final    Comment:      The estimated GFR is a calculation valid for adults (>=28 years old) that uses the CKD-EPI algorithm to adjust for age and sex. It is   not to be used for children, pregnant women, hospitalized patients,    patients on dialysis, or with rapidly changing kidney function. According to the NKDEP, eGFR >89 is normal, 60-89 shows mild impairment, 30-59 shows moderate impairment, 15-29 shows severe impairment and <15 is ESRD.      GFR calc non Af Amer  Date Value Ref Range Status  06/08/2018 76 >59 mL/min/1.73 Final         Passed - HBA1C is between 0 and 7.9 and within 180 days    Hemoglobin A1C  Date Value Ref Range Status  01/11/2019 5.9 (A) 4.0 - 5.6 % Final   Hgb A1c MFr Bld  Date Value Ref Range Status  06/08/2018 6.3 (H) 4.8 - 5.6 % Final    Comment:             Prediabetes: 5.7 - 6.4          Diabetes:  >6.4          Glycemic control for adults with diabetes: <7.0          Passed - Valid encounter within last 6 months    Recent Outpatient Visits          5 months ago Type 2 diabetes mellitus with hyperglycemia, without long-term current use of insulin ([place])   [facility], Algis Greenhouse, MD   8 months ago Skin lesion   [facility] [place], [place], MD   1 year ago Type 2 diabetes mellitus with hyperglycemia, without long-term current use of insulin [facility])   [facility] [place], PA-C   1 year ago Rib pain on left side   [facility] [facility], Audrie Lia, PA-C   1 year ago Encounter for postoperative wound care   [facility] [place], PA-C      Future Appointments            In 2 weeks Vivi Barrack, MD [place]  [place], [place]

## 2019-06-17 ENCOUNTER — Encounter: Payer: 59 | Admitting: Family Medicine

## 2019-06-17 NOTE — Telephone Encounter (Signed)
See note

## 2019-07-01 ENCOUNTER — Ambulatory Visit (INDEPENDENT_AMBULATORY_CARE_PROVIDER_SITE_OTHER): Payer: 59 | Admitting: Family Medicine

## 2019-07-01 ENCOUNTER — Encounter: Payer: Self-pay | Admitting: Family Medicine

## 2019-07-01 ENCOUNTER — Other Ambulatory Visit: Payer: Self-pay

## 2019-07-01 VITALS — BP 110/72 | HR 70 | Temp 98.1°F | Ht 70.5 in | Wt 253.5 lb

## 2019-07-01 DIAGNOSIS — Z125 Encounter for screening for malignant neoplasm of prostate: Secondary | ICD-10-CM

## 2019-07-01 DIAGNOSIS — M2550 Pain in unspecified joint: Secondary | ICD-10-CM | POA: Diagnosis not present

## 2019-07-01 DIAGNOSIS — Z0001 Encounter for general adult medical examination with abnormal findings: Secondary | ICD-10-CM | POA: Diagnosis not present

## 2019-07-01 DIAGNOSIS — E1169 Type 2 diabetes mellitus with other specified complication: Secondary | ICD-10-CM | POA: Diagnosis not present

## 2019-07-01 DIAGNOSIS — E1165 Type 2 diabetes mellitus with hyperglycemia: Secondary | ICD-10-CM

## 2019-07-01 DIAGNOSIS — N529 Male erectile dysfunction, unspecified: Secondary | ICD-10-CM

## 2019-07-01 DIAGNOSIS — E785 Hyperlipidemia, unspecified: Secondary | ICD-10-CM

## 2019-07-01 DIAGNOSIS — Z6835 Body mass index (BMI) 35.0-35.9, adult: Secondary | ICD-10-CM

## 2019-07-01 NOTE — Assessment & Plan Note (Signed)
Check A1c.  Continue metformin 500 mg 3 times daily.  Follow-up in 3 to 6 months.

## 2019-07-01 NOTE — Progress Notes (Signed)
Chief Complaint:  Joe Todd is a 63 y.o. male who presents today for his annual comprehensive physical exam.    Assessment/Plan:  Type 2 diabetes mellitus with hyperglycemia (HCC) Check A1c.  Continue metformin 500 mg 3 times daily.  Follow-up in 3 to 6 months.  Arthralgia Continue over-the-counter medications as needed.  Erectile dysfunction Stable.  Continue sildenafil as needed.  Dyslipidemia associated with type 2 diabetes mellitus (HCC) Check lipid panel, CBC, C met, and TSH.a   Body mass index is 35.86 kg/m. / Obese BMI Metric Follow Up - 07/01/19 1520      BMI Metric Follow Up-Please document annually   BMI Metric Follow Up  Education provided        Preventative Healthcare: Flu vaccine declined. Deferred pneumonia vaccine today. Check PSA.   Patient Counseling(The following topics were reviewed and/or handout was given):  -Nutrition: Stressed importance of moderation in sodium/caffeine intake, saturated fat and cholesterol, caloric balance, sufficient intake of fresh fruits, vegetables, and fiber.  -Stressed the importance of regular exercise.   -Substance Abuse: Discussed cessation/primary prevention of tobacco, alcohol, or other drug use; driving or other dangerous activities under the influence; availability of treatment for abuse.   -Injury prevention: Discussed safety belts, safety helmets, smoke detector, smoking near bedding or upholstery.   -Sexuality: Discussed sexually transmitted diseases, partner selection, use of condoms, avoidance of unintended pregnancy and contraceptive alternatives.   -Dental health: Discussed importance of regular tooth brushing, flossing, and dental visits.  -Health maintenance and immunizations reviewed. Please refer to Health maintenance section.  Return to care in 1 year for next preventative visit.     Subjective:  HPI:  He has no acute complaints today.   His stable, chronic medical conditions are outlined below:   # T2DM - On metformin '500mg'$  three times daily and tolerating well - ROS: No reported polyuria or polydipsia  # Dyslipidemia - Not taking atorvastatin - ROS: No reported chest pain or shortness of breath.  # Erectile Dysfunction - Takes sildenafil as needed  # Chronic Back Pain /multisite arthritis - Follows with interventional pain management - Takes OTC acetaminophen as needed  Lifestyle Diet: No specific diets or eating plans.  Exercise: Limited due to chronic pain. Wants to get back into walking.   Depression screen PHQ 2/9 07/01/2019  Decreased Interest 0  Down, Depressed, Hopeless 0  PHQ - 2 Score 0    Health Maintenance Due  Topic Date Due  . FOOT EXAM  06/09/2019  . URINE MICROALBUMIN  06/09/2019  . OPHTHALMOLOGY EXAM  06/18/2019     ROS: Per HPI, otherwise a complete review of systems was negative.   PMH:  The following were reviewed and entered/updated in epic: Past Medical History:  Diagnosis Date  . Diabetes mellitus without complication ([place])   . NSAID long-term use 01/23/2017   Advised he try to use Tylenol 1000 mg q8 to avoid long term complications. He is currently taking 800 mg in the morning and two aleve PM at night.    Patient Active Problem List   Diagnosis Date Noted  . Dyslipidemia associated with type 2 diabetes mellitus ([place]) 01/11/2019  . Erectile dysfunction 01/11/2019  . Arthralgia 01/11/2019  . Type 2 diabetes mellitus with hyperglycemia ([place]) 01/26/2016   Past Surgical History:  Procedure Laterality Date  . Bilateral shoulder surgery    . HAND SURGERY    . HERNIA REPAIR    . 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 Cancer Mother   . Hyperlipidemia Father   . Hypertension Father   . Heart disease Father   . Diabetes Father     Medications- reviewed and updated Current Outpatient Medications  Medication Sig Dispense Refill  . L-Lysine 1000 MG TABS Take by mouth.    .  metFORMIN (GLUCOPHAGE) 500 MG tablet TAKE 1 TABLET BY MOUTH THREE TIMES A DAY 270 tablet 0  . Multiple Vitamins-Minerals (MENS 50+ MULTI VITAMIN/MIN PO) Take by mouth daily.    . ONE TOUCH LANCETS MISC Use once a day: One Touch Verio flex 100 each 11  . atorvastatin (LIPITOR) 20 MG tablet Take 1 daily. (Patient not taking: Reported on 07/01/2019) 90 tablet 3   No current facility-administered medications for this visit.     Allergies-reviewed and updated Allergies  Allergen Reactions  . Penicillins Other (See Comments)    Not remember    Social History   Socioeconomic History  . Marital status: Married    Spouse name: Not on file  . Number of children: Not on file  . Years of education: Not on file  . Highest education level: Not on file  Occupational History  . Not on file  Social Needs  . Financial resource strain: Not on file  . Food insecurity    Worry: Not on file    Inability: Not on file  . Transportation needs    Medical: Not on file    Non-medical: Not on file  Tobacco Use  . Smoking status: Never Smoker  . Smokeless tobacco: Never Used  Substance and Sexual Activity  . Alcohol use: Not Currently  . Drug use: No  . Sexual activity: Not on file  Lifestyle  . Physical activity    Days per week: Not on file    Minutes per session: Not on file  . Stress: Not on file  Relationships  . Social Herbalist on phone: Not on file    Gets together: Not on file    Attends religious service: Not on file    Active member of club or organization: Not on file    Attends meetings of clubs or organizations: Not on file    Relationship status: Not on file  Other Topics Concern  . Not on file  Social History Narrative  . Not on file        Objective:  Physical Exam: BP 110/72   Pulse 70   Temp 98.1 F (36.7 C)   Ht 5' 10.5" (1.791 m)   Wt 253 lb 8 oz (115 kg)   SpO2 97%   BMI 35.86 kg/m   Body mass index is 35.86 kg/m. Wt Readings from Last 3  Encounters:  07/01/19 253 lb 8 oz (115 kg)  01/11/19 259 lb 9.6 oz (117.8 kg)  10/16/18 256 lb 6.4 oz (116.3 kg)  Gen: NAD, resting comfortably HEENT: TMs normal bilaterally. OP clear. No thyromegaly noted.  CV: RRR with no murmurs appreciated Pulm: NWOB, CTAB with no crackles, wheezes, or rhonchi GI: Normal bowel sounds present. Soft, Nontender, Nondistended. MSK: no edema, cyanosis, or clubbing noted Skin: warm, dry Neuro: CN2-12 grossly intact. Strength 5/5 in upper and lower extremities. Reflexes symmetric and intact bilaterally.  Psych: Normal affect and thought content     Sukhdeep Wieting M. Jerline Pain, MD 07/01/2019 3:23 PM

## 2019-07-01 NOTE — Assessment & Plan Note (Signed)
Continue over-the-counter medications as needed.

## 2019-07-01 NOTE — Addendum Note (Signed)
Addended by: Francis Dowse T on: 07/01/2019 03:34 PM   Modules accepted: Orders

## 2019-07-01 NOTE — Assessment & Plan Note (Signed)
Stable. Continue sildenafil as needed. 

## 2019-07-01 NOTE — Patient Instructions (Addendum)
It was very nice to see you today!  We will check blood work and a urine sample today.  No other changes today.  Come back in 6 months, or sooner if needed.   Take care, Dr Jerline Pain  Please try these tips to maintain a healthy lifestyle:   Eat at least 3 REAL meals and 1-2 snacks per day.  Aim for no more than 5 hours between eating.  If you eat breakfast, please do so within one hour of getting up.    Obtain twice as many fruits/vegetables as protein or carbohydrate foods for both lunch and dinner. (Half of each meal should be fruits/vegetables, one quarter protein, and one quarter starchy carbs)   Cut down on sweet beverages. This includes juice, soda, and sweet tea.    Exercise at least 150 minutes every week.    Preventive Care 65 Years and Older, Male Preventive care refers to lifestyle choices and visits with your health care provider that can promote health and wellness. This includes:  A yearly physical exam. This is also called an annual well check.  Regular dental and eye exams.  Immunizations.  Screening for certain conditions.  Healthy lifestyle choices, such as diet and exercise. What can I expect for my preventive care visit? Physical exam Your health care provider will check:  Height and weight. These may be used to calculate body mass index (BMI), which is a measurement that tells if you are at a healthy weight.  Heart rate and blood pressure.  Your skin for abnormal spots. Counseling Your health care provider may ask you questions about:  Alcohol, tobacco, and drug use.  Emotional well-being.  Home and relationship well-being.  Sexual activity.  Eating habits.  History of falls.  Memory and ability to understand (cognition).  Work and work Statistician. What immunizations do I need?  Influenza (flu) vaccine  This is recommended every year. Tetanus, diphtheria, and pertussis (Tdap) vaccine  You may need a Td booster every 10 years.  Varicella (chickenpox) vaccine  You may need this vaccine if you have not already been vaccinated. Zoster (shingles) vaccine  You may need this after age 34. Pneumococcal conjugate (PCV13) vaccine  One dose is recommended after age 55. Pneumococcal polysaccharide (PPSV23) vaccine  One dose is recommended after age 2. Measles, mumps, and rubella (MMR) vaccine  You may need at least one dose of MMR if you were born in 1957 or later. You may also need a second dose. Meningococcal conjugate (MenACWY) vaccine  You may need this if you have certain conditions. Hepatitis A vaccine  You may need this if you have certain conditions or if you travel or work in places where you may be exposed to hepatitis A. Hepatitis B vaccine  You may need this if you have certain conditions or if you travel or work in places where you may be exposed to hepatitis B. Haemophilus influenzae type b (Hib) vaccine  You may need this if you have certain conditions. You may receive vaccines as individual doses or as more than one vaccine together in one shot (combination vaccines). Talk with your health care provider about the risks and benefits of combination vaccines. What tests do I need? Blood tests  Lipid and cholesterol levels. These may be checked every 5 years, or more frequently depending on your overall health.  Hepatitis C test.  Hepatitis B test. Screening  Lung cancer screening. You may have this screening every year starting at age 54 if you  have a 30-pack-year history of smoking and currently smoke or have quit within the past 15 years.  Colorectal cancer screening. All adults should have this screening starting at age 7 and continuing until age 33. Your health care provider may recommend screening at age 31 if you are at increased risk. You will have tests every 1-10 years, depending on your results and the type of screening test.  Prostate cancer screening. Recommendations will vary  depending on your family history and other risks.  Diabetes screening. This is done by checking your blood sugar (glucose) after you have not eaten for a while (fasting). You may have this done every 1-3 years.  Abdominal aortic aneurysm (AAA) screening. You may need this if you are a current or former smoker.  Sexually transmitted disease (STD) testing. Follow these instructions at home: Eating and drinking  Eat a diet that includes fresh fruits and vegetables, whole grains, lean protein, and low-fat dairy products. Limit your intake of foods with high amounts of sugar, saturated fats, and salt.  Take vitamin and mineral supplements as recommended by your health care provider.  Do not drink alcohol if your health care provider tells you not to drink.  If you drink alcohol: ? Limit how much you have to 0-2 drinks a day. ? Be aware of how much alcohol is in your drink. In the [place] one drink equals one 12 oz bottle of beer (355 mL), one 5 oz glass of wine (148 mL), or one 1 oz glass of hard liquor (44 mL). Lifestyle  Take daily care of your teeth and gums.  Stay active. Exercise for at least 30 minutes on 5 or more days each week.  Do not use any products that contain nicotine or tobacco, such as cigarettes, e-cigarettes, and chewing tobacco. If you need help quitting, ask your health care provider.  If you are sexually active, practice safe sex. Use a condom or other form of protection to prevent STIs (sexually transmitted infections).  Talk with your health care provider about taking a low-dose aspirin or statin. What's next?  Visit your health care provider once a year for a well check visit.  Ask your health care provider how often you should have your eyes and teeth checked.  Stay up to date on all vaccines. This information is not intended to replace advice given to you by your health care provider. Make sure you discuss any questions you have with your health care  provider. Document Released: 10/30/2015 Document Revised: 09/27/2018 Document Reviewed: 09/27/2018 Elsevier Patient Education  2020 [employer].

## 2019-07-01 NOTE — Assessment & Plan Note (Signed)
Check lipid panel, CBC, C met, and TSH.a

## 2019-07-02 ENCOUNTER — Other Ambulatory Visit: Payer: 59

## 2019-07-02 DIAGNOSIS — E1165 Type 2 diabetes mellitus with hyperglycemia: Secondary | ICD-10-CM

## 2019-07-02 LAB — CBC
HCT: 41.4 % (ref 39.0–52.0)
Hemoglobin: 13.9 g/dL (ref 13.0–17.0)
MCHC: 33.6 g/dL (ref 30.0–36.0)
MCV: 95.8 fl (ref 78.0–100.0)
Platelets: 250 10*3/uL (ref 150.0–400.0)
RBC: 4.32 Mil/uL (ref 4.22–5.81)
RDW: 13.1 % (ref 11.5–15.5)
WBC: 5.9 10*3/uL (ref 4.0–10.5)

## 2019-07-02 LAB — LIPID PANEL
Cholesterol: 198 mg/dL (ref 0–200)
HDL: 42.9 mg/dL (ref >39.00)
NonHDL: 155.5
Total CHOL/HDL Ratio: 5
Triglycerides: 265 mg/dL — ABNORMAL HIGH (ref 0.0–149.0)
VLDL: 53 mg/dL — ABNORMAL HIGH (ref 0.0–40.0)

## 2019-07-02 LAB — COMPREHENSIVE METABOLIC PANEL
ALT: 16 U/L (ref 0–53)
AST: 15 U/L (ref 0–37)
Albumin: 4.7 g/dL (ref 3.5–5.2)
Alkaline Phosphatase: 57 U/L (ref 39–117)
BUN: 18 mg/dL (ref 6–23)
CO2: 26 mEq/L (ref 19–32)
Calcium: 10.2 mg/dL (ref 8.4–10.5)
Chloride: 103 mEq/L (ref 96–112)
Creatinine, Ser: 0.97 mg/dL (ref 0.40–1.50)
GFR: 77.58 mL/min (ref >60.00)
Glucose, Bld: 97 mg/dL (ref 70–99)
Potassium: 4.5 mEq/L (ref 3.5–5.1)
Sodium: 139 mEq/L (ref 135–145)
Total Bilirubin: 0.9 mg/dL (ref 0.2–1.2)
Total Protein: 6.7 g/dL (ref 6.0–8.3)

## 2019-07-02 LAB — TSH: TSH: 2.19 u[IU]/mL (ref 0.35–4.50)

## 2019-07-02 LAB — MICROALBUMIN / CREATININE URINE RATIO
Creatinine,U: 103.8 mg/dL
Microalb Creat Ratio: 0.7 mg/g (ref 0.0–30.0)
Microalb, Ur: <0.7 mg/dL (ref 0.0–1.9)

## 2019-07-02 LAB — PSA: PSA: 3.11 ng/mL (ref 0.10–4.00)

## 2019-07-02 LAB — LDL CHOLESTEROL, DIRECT: Direct LDL: 119 mg/dL

## 2019-07-02 LAB — HEMOGLOBIN A1C: Hgb A1c MFr Bld: 6.4 % (ref 4.6–6.5)

## 2019-07-03 NOTE — Progress Notes (Signed)
Please inform patient of the following:  Triglyceride levels are up but overall his cholesterol levels are stable compared to last year. Ok for him to stay off statin for another year.  A1c up slightly - would like for him to continue current dose of metformin.  Everything else is normal. Do not need to make any other changes.  Would like for him to come back in 6 months to recheck A1c.  Joe Todd. Jerline Pain, MD 07/03/2019 10:29 AM

## 2019-07-04 ENCOUNTER — Other Ambulatory Visit: Payer: Self-pay

## 2019-07-04 DIAGNOSIS — E1165 Type 2 diabetes mellitus with hyperglycemia: Secondary | ICD-10-CM

## 2019-07-04 MED ORDER — METFORMIN HCL 500 MG PO TABS: 500.0000 mg | ORAL_TABLET | Freq: Three times a day (TID) | ORAL | 0 refills | Status: DC

## 2019-08-02 ENCOUNTER — Ambulatory Visit (INDEPENDENT_AMBULATORY_CARE_PROVIDER_SITE_OTHER): Payer: 59 | Admitting: Family Medicine

## 2019-08-02 DIAGNOSIS — R252 Cramp and spasm: Secondary | ICD-10-CM

## 2019-08-02 MED ORDER — GABAPENTIN 100 MG PO CAPS: 100.0000 mg | ORAL_CAPSULE | Freq: Every day | ORAL | 3 refills | Status: DC

## 2019-08-02 NOTE — Assessment & Plan Note (Signed)
No red flags.  Has tried conservative management with stretches with no improvement.  Recommended he take vitamin D and vitamin D supplementation.  Will start low-dose gabapentin.  Discussed reasons to return to care.

## 2019-08-02 NOTE — Progress Notes (Signed)
    Chief Complaint:  Joe Todd is a 63 y.o. male who presents for a telephone visit with a chief complaint of Foot Pain.   Assessment/Plan:  Leg cramp No red flags.  Has tried conservative management with stretches with no improvement.  Recommended he take vitamin D and vitamin D supplementation.  Will start low-dose gabapentin.  Discussed reasons to return to care.     Subjective:  HPI:  Foot Pain Started several years ago. Comes and goes. Worse over the past week or so. He is getting cramps in his feet at night.  Located around his ankles.  Try drinking a lot of water.  No significant improvement.  No swelling.  No numbness or tingling.  No stretch out his ankles which helps.  No other obvious aggravating or alleviating factors.  ROS: Per HPI  PMH: He reports that he has never smoked. He has never used smokeless tobacco. He reports previous alcohol use. He reports that he does not use drugs.      Objective/Observations   No results found for this or any previous visit (from the past 72 hour(s)).   Telephone Visit   I connected with Joe Todd on 08/02/19 at  3:40 PM EDT via telephone and verified that I am speaking with the correct person using two identifiers. I discussed the limitations of evaluation and management by telemedicine and the availability of in person appointments. The patient expressed understanding and agreed to proceed.   Patient location: Home Provider location: [place] participating in the virtual visit: Myself and patient      Algis Greenhouse. Jerline Pain, MD 08/02/2019 3:48 PM

## 2019-08-26 IMAGING — DX DG RIBS 2V*L*
4 series · 4 of 4 positions shown · non-contrast
Comparison: 07/27/2017

CLINICAL DATA: Left rib pain

EXAM:
LEFT RIBS - 2 VIEW

[rib obl (1 of 2)]
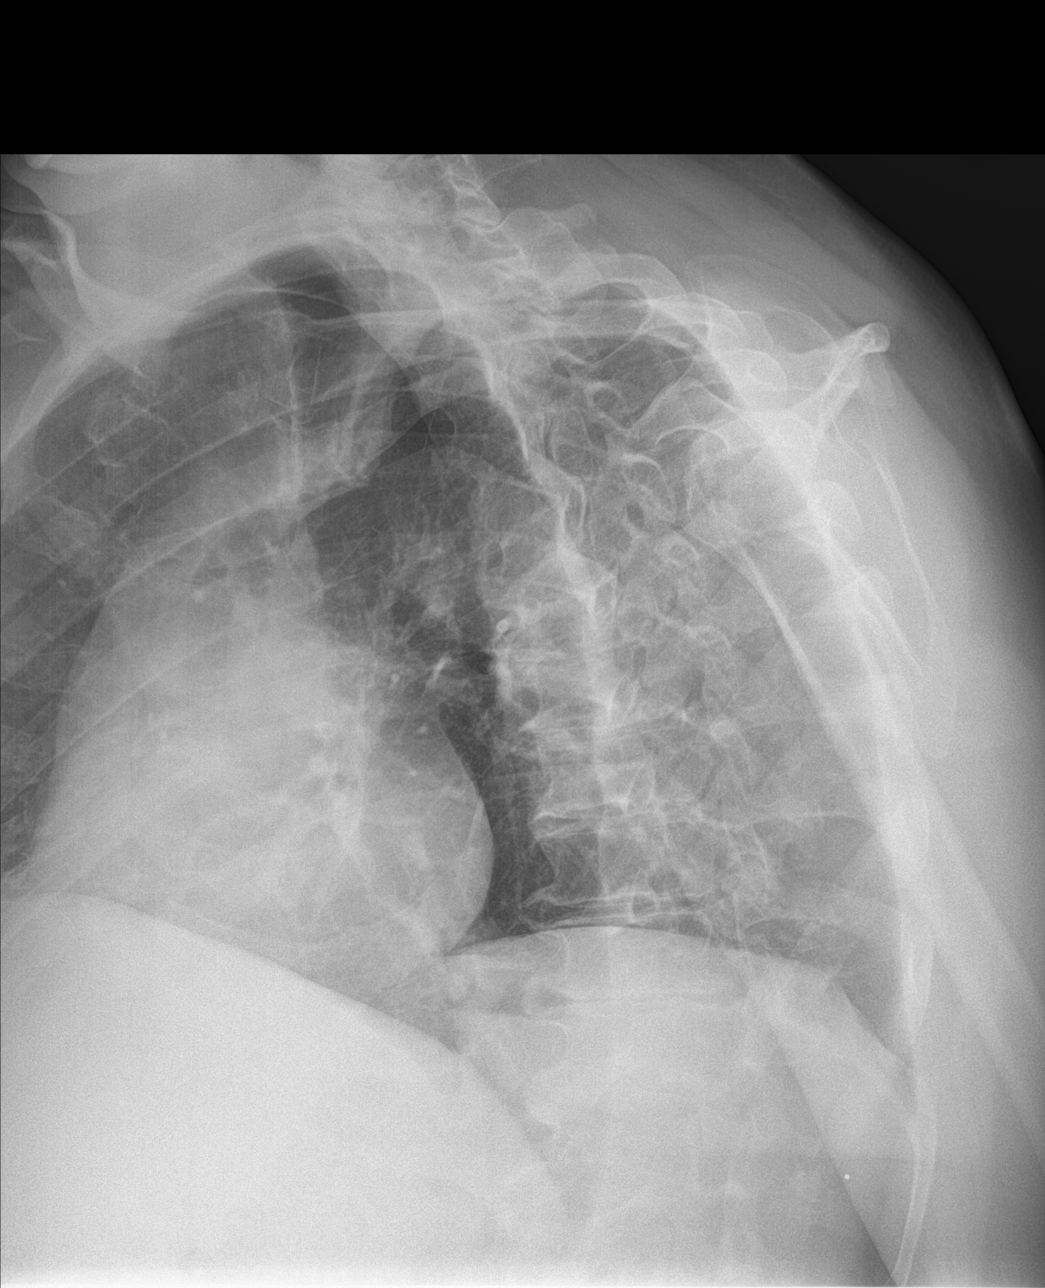

[rib obl (2 of 2)]
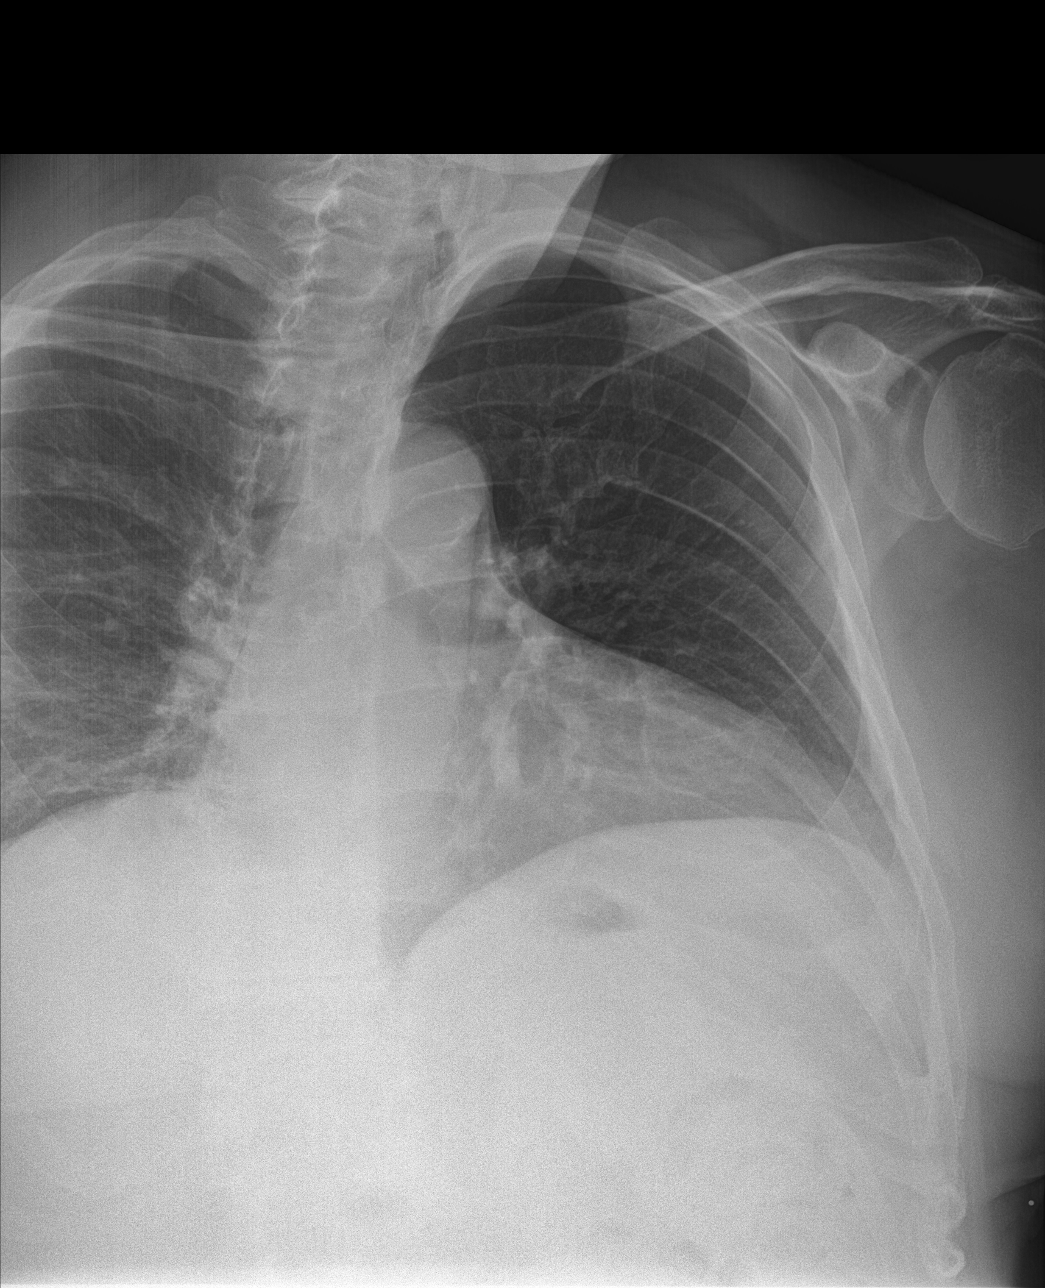

[chest ap]
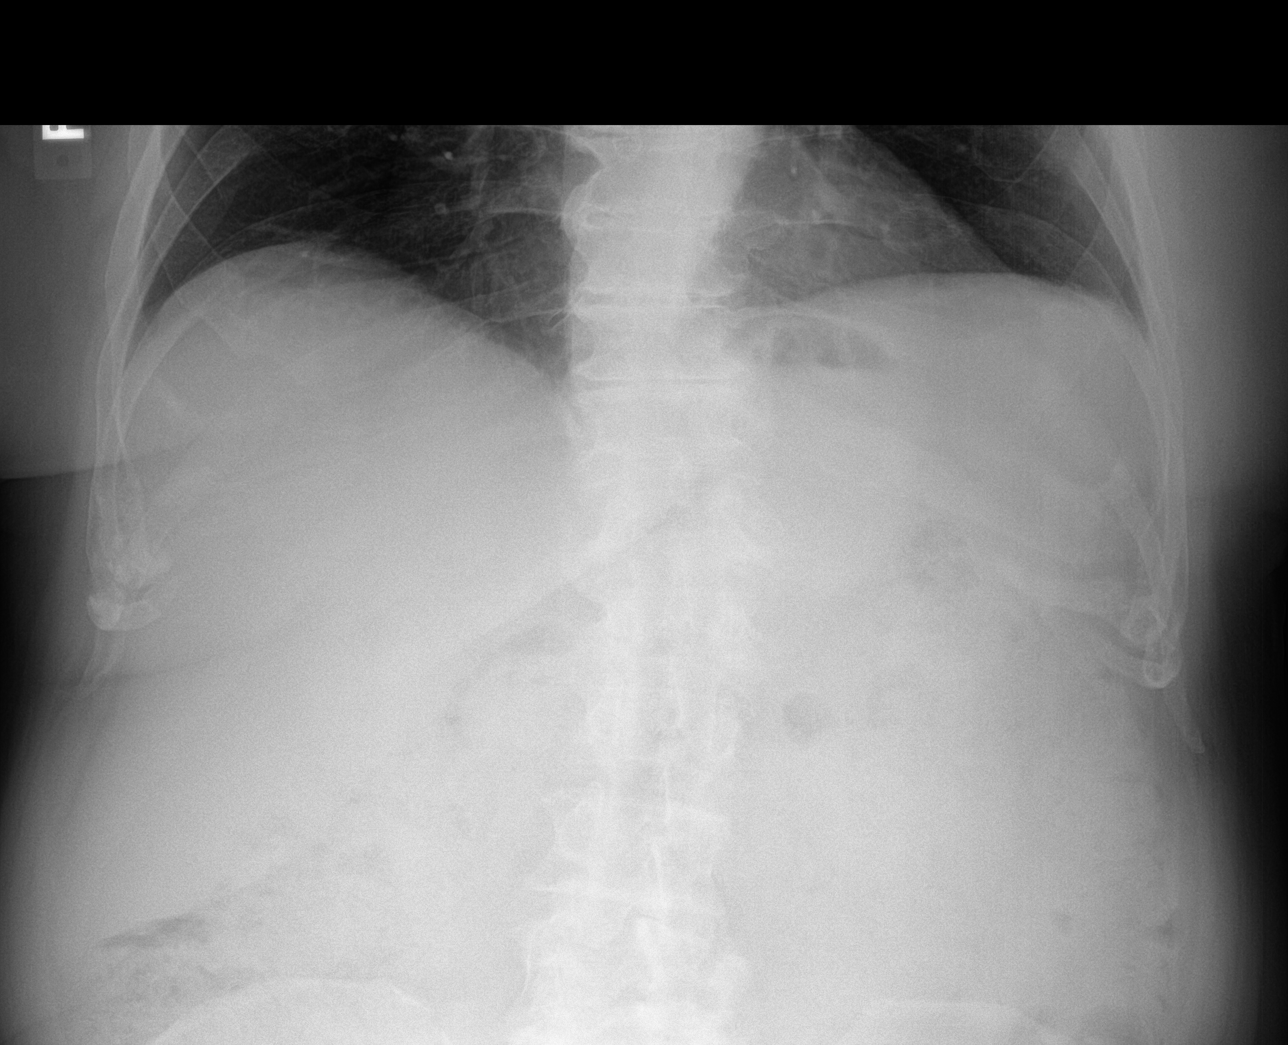

[chest pa]
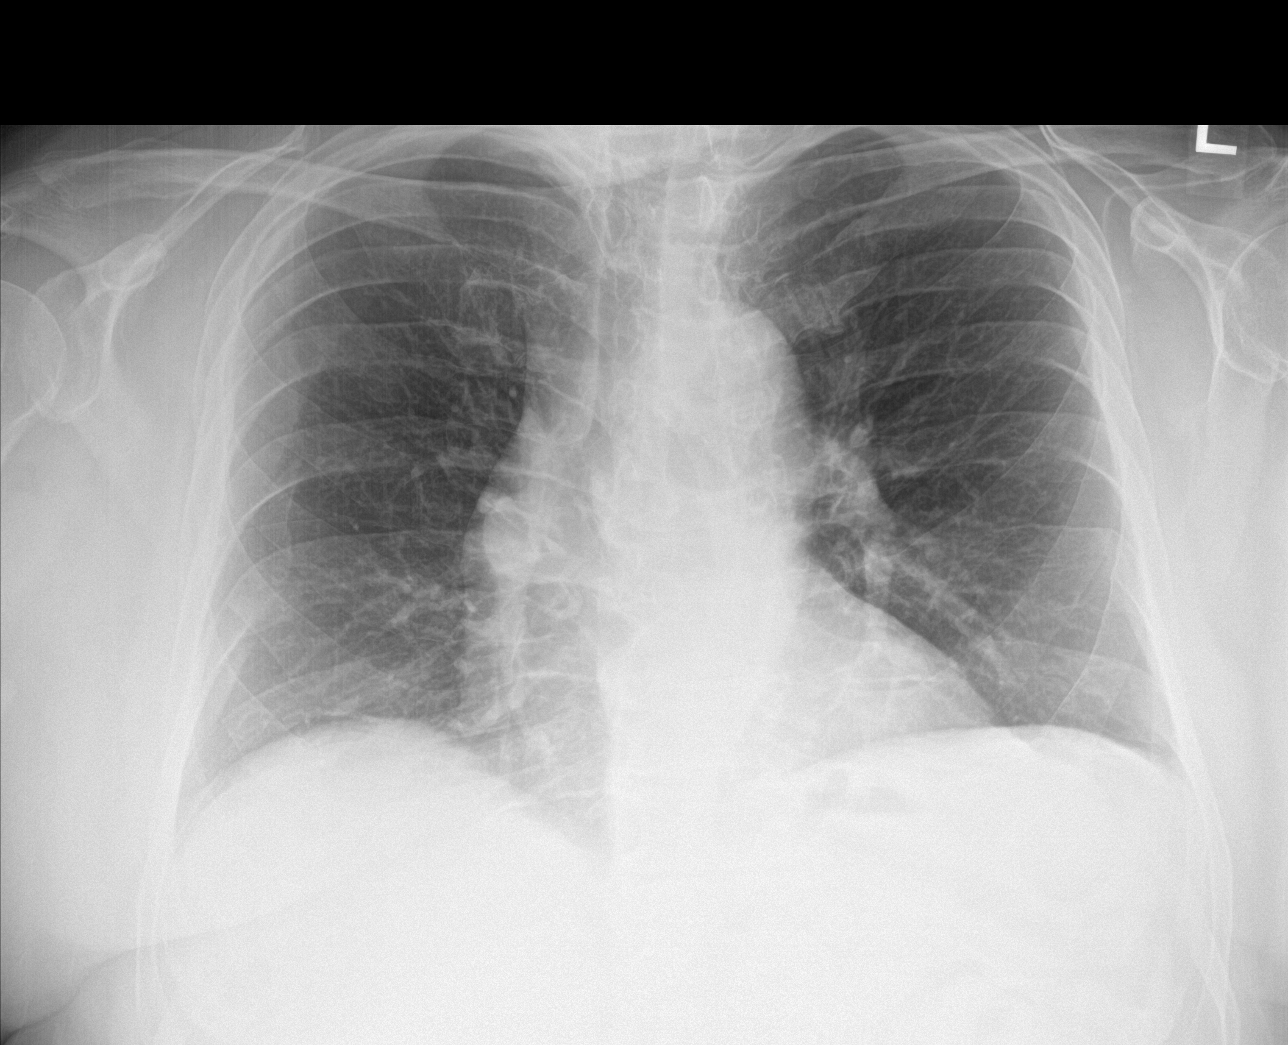

[4 of 4 positions shown; findings below may reference images not displayed]

FINDINGS: No fracture or other bone lesions are seen involving the ribs.

Both lungs are clear.
IMPRESSION: Negative.

## 2019-09-23 ENCOUNTER — Other Ambulatory Visit: Payer: Self-pay | Admitting: Orthopedic Surgery

## 2019-10-01 ENCOUNTER — Other Ambulatory Visit (HOSPITAL_COMMUNITY)
Admission: RE | Admit: 2019-10-01 | Discharge: 2019-10-01 | Disposition: A | Discharge status: Home / Self Care | Payer: 59 | Source: Ambulatory Visit | Attending: Orthopedic Surgery | Admitting: Orthopedic Surgery

## 2019-10-01 ENCOUNTER — Encounter (HOSPITAL_BASED_OUTPATIENT_CLINIC_OR_DEPARTMENT_OTHER): Payer: Self-pay | Admitting: Orthopedic Surgery

## 2019-10-01 ENCOUNTER — Telehealth: Payer: Self-pay | Admitting: Family Medicine

## 2019-10-01 ENCOUNTER — Encounter (HOSPITAL_BASED_OUTPATIENT_CLINIC_OR_DEPARTMENT_OTHER)
Admission: RE | Admit: 2019-10-01 | Discharge: 2019-10-01 | Disposition: A | Discharge status: Home / Self Care | Payer: 59 | Source: Ambulatory Visit | Attending: Orthopedic Surgery | Admitting: Orthopedic Surgery

## 2019-10-01 ENCOUNTER — Other Ambulatory Visit: Payer: Self-pay

## 2019-10-01 DIAGNOSIS — Z20828 Contact with and (suspected) exposure to other viral communicable diseases: Secondary | ICD-10-CM | POA: Diagnosis not present

## 2019-10-01 DIAGNOSIS — Z01812 Encounter for preprocedural laboratory examination: Secondary | ICD-10-CM | POA: Diagnosis not present

## 2019-10-01 LAB — BASIC METABOLIC PANEL
Anion gap: 9 (ref 5–15)
BUN: 12 mg/dL (ref 8–23)
CO2: 27 mmol/L (ref 22–32)
Calcium: 9.4 mg/dL (ref 8.9–10.3)
Chloride: 101 mmol/L (ref 98–111)
Creatinine, Ser: 1 mg/dL (ref 0.61–1.24)
GFR calc Af Amer: >60 mL/min (ref >60)
GFR calc non Af Amer: >60 mL/min (ref >60)
Glucose, Bld: 100 mg/dL — ABNORMAL HIGH (ref 70–99)
Potassium: 5.1 mmol/L (ref 3.5–5.1)
Sodium: 137 mmol/L (ref 135–145)

## 2019-10-01 NOTE — Telephone Encounter (Signed)
Patient came in today and was quite upset that his billing issue was not resolved. He stated that he felt that he was being given the run around and has spoken with too many people including billing. He stated that the urine specimen that was done on 07/02/2019 should have been fully covered by his insurance from his 07/01/2019 visit as it was preventative. He stated that the reason he had to come on the next day for his urine is because he could not give a urine specimen at the time of visit.  Patient stated he has spoken to billing multiple times starting in November about the issue and someone from our office. I have not spoken to the patient about the issue prior to today and there is not documentation that anyone from our office or our patient engagement center has either.   The last note in account notes from the billing department stated "Pt called and vid hipaa, pt wanted to f/u on [facility] 07/01/2019 for coding review, advise of coding review results, based on labs orders coding is correct, pt stated the doctor office stated they was going to correct it because it was wrong, advise do not see any update to support that, pt understood and will talk with doctor, pt also made payment in amt of 20.00 for [facility] 08/02/2019."   I attempted to advise that I would look into this for him and apologized for what seemed to be a run around for him.   Patient is wanting a call from the practice administrator or from the coding department directly to resolve this matter and have everything resubmitted to his insurance once the coding or documentation is changed to be covered.  Please contact patient to advise.

## 2019-10-01 NOTE — Progress Notes (Signed)

## 2019-10-02 ENCOUNTER — Other Ambulatory Visit: Payer: Self-pay | Admitting: Orthopedic Surgery

## 2019-10-02 LAB — NOVEL CORONAVIRUS, NAA (HOSP ORDER, SEND-OUT TO REF LAB; TAT 18-24 HRS): SARS-CoV-2, NAA: NOT DETECTED

## 2019-10-02 NOTE — Telephone Encounter (Signed)
I have looked at the coding again on these and can not change due to documentation.  He has conditions that the labs were performed for the monitoring, they are not screening labs. Can not perform screening labs if he already has these conditions.   Let me know if other questions.  Thank you,  Dawn

## 2019-10-04 ENCOUNTER — Encounter (HOSPITAL_BASED_OUTPATIENT_CLINIC_OR_DEPARTMENT_OTHER): Payer: Self-pay | Admitting: Orthopedic Surgery

## 2019-10-04 ENCOUNTER — Other Ambulatory Visit: Payer: Self-pay

## 2019-10-04 ENCOUNTER — Ambulatory Visit (HOSPITAL_BASED_OUTPATIENT_CLINIC_OR_DEPARTMENT_OTHER): Payer: 59 | Admitting: Anesthesiology

## 2019-10-04 ENCOUNTER — Ambulatory Visit (HOSPITAL_BASED_OUTPATIENT_CLINIC_OR_DEPARTMENT_OTHER)
Admission: RE | Admit: 2019-10-04 | Discharge: 2019-10-04 | Disposition: A | Discharge status: Home / Self Care | Payer: 59 | Attending: Orthopedic Surgery | Admitting: Orthopedic Surgery

## 2019-10-04 ENCOUNTER — Encounter (HOSPITAL_BASED_OUTPATIENT_CLINIC_OR_DEPARTMENT_OTHER)
Admission: RE | Disposition: A | Discharge status: Home / Self Care | Payer: Self-pay | Source: Home / Self Care | Attending: Orthopedic Surgery

## 2019-10-04 DIAGNOSIS — Z791 Long term (current) use of non-steroidal anti-inflammatories (NSAID): Secondary | ICD-10-CM | POA: Diagnosis not present

## 2019-10-04 DIAGNOSIS — E119 Type 2 diabetes mellitus without complications: Secondary | ICD-10-CM | POA: Insufficient documentation

## 2019-10-04 DIAGNOSIS — Z79899 Other long term (current) drug therapy: Secondary | ICD-10-CM | POA: Diagnosis not present

## 2019-10-04 DIAGNOSIS — Z7984 Long term (current) use of oral hypoglycemic drugs: Secondary | ICD-10-CM | POA: Insufficient documentation

## 2019-10-04 DIAGNOSIS — Z833 Family history of diabetes mellitus: Secondary | ICD-10-CM | POA: Diagnosis not present

## 2019-10-04 DIAGNOSIS — E669 Obesity, unspecified: Secondary | ICD-10-CM | POA: Diagnosis not present

## 2019-10-04 DIAGNOSIS — Z96642 Presence of left artificial hip joint: Secondary | ICD-10-CM | POA: Diagnosis not present

## 2019-10-04 DIAGNOSIS — Z880 Allergy status to penicillin: Secondary | ICD-10-CM | POA: Insufficient documentation

## 2019-10-04 DIAGNOSIS — Z8249 Family history of ischemic heart disease and other diseases of the circulatory system: Secondary | ICD-10-CM | POA: Insufficient documentation

## 2019-10-04 DIAGNOSIS — Z809 Family history of malignant neoplasm, unspecified: Secondary | ICD-10-CM | POA: Insufficient documentation

## 2019-10-04 DIAGNOSIS — M19032 Primary osteoarthritis, left wrist: Secondary | ICD-10-CM | POA: Diagnosis not present

## 2019-10-04 DIAGNOSIS — Z6834 Body mass index (BMI) 34.0-34.9, adult: Secondary | ICD-10-CM | POA: Insufficient documentation

## 2019-10-04 HISTORY — PX: CARPECTOMY: SHX5004

## 2019-10-04 LAB — GLUCOSE, CAPILLARY
Glucose-Capillary: 105 mg/dL — ABNORMAL HIGH (ref 70–99)
Glucose-Capillary: 82 mg/dL (ref 70–99)

## 2019-10-04 SURGERY — CARPECTOMY
Anesthesia: Monitor Anesthesia Care | Site: Wrist | Laterality: Left

## 2019-10-04 MED ORDER — CHLORHEXIDINE GLUCONATE 4 % EX LIQD: 60.0000 mL | Freq: Once | CUTANEOUS | Status: DC

## 2019-10-04 MED ORDER — SODIUM CHLORIDE 0.9 % IR SOLN
Status: DC | PRN
  Administered 2019-10-04: 1

## 2019-10-04 MED ORDER — OXYCODONE HCL 5 MG/5ML PO SOLN: 5.0000 mg | Freq: Once | ORAL | Status: DC | PRN

## 2019-10-04 MED ORDER — OXYCODONE HCL 5 MG PO TABS: 5.0000 mg | ORAL_TABLET | Freq: Once | ORAL | Status: DC | PRN

## 2019-10-04 MED ORDER — CLINDAMYCIN PHOSPHATE 900 MG/50ML IV SOLN
INTRAVENOUS | Status: AC
  Filled 2019-10-04: qty 50

## 2019-10-04 MED ORDER — MIDAZOLAM HCL 2 MG/2ML IJ SOLN
INTRAMUSCULAR | Status: AC
  Filled 2019-10-04: qty 2

## 2019-10-04 MED ORDER — OXYCODONE-ACETAMINOPHEN 5-325 MG PO TABS: 1.0000 | ORAL_TABLET | ORAL | 0 refills | Status: AC | PRN

## 2019-10-04 MED ORDER — PROPOFOL 500 MG/50ML IV EMUL
INTRAVENOUS | Status: DC | PRN
  Administered 2019-10-04: 75 ug/kg/min via INTRAVENOUS

## 2019-10-04 MED ORDER — FENTANYL CITRATE (PF) 100 MCG/2ML IJ SOLN
INTRAMUSCULAR | Status: AC
  Filled 2019-10-04: qty 2

## 2019-10-04 MED ORDER — DEXMEDETOMIDINE HCL IN NACL 200 MCG/50ML IV SOLN
INTRAVENOUS | Status: DC | PRN
  Administered 2019-10-04 (×2): 8 ug via INTRAVENOUS

## 2019-10-04 MED ORDER — CLINDAMYCIN PHOSPHATE 900 MG/50ML IV SOLN
900.0000 mg | INTRAVENOUS | Status: AC
  Administered 2019-10-04: 900 mg via INTRAVENOUS

## 2019-10-04 MED ORDER — LIDOCAINE 2% (20 MG/ML) 5 ML SYRINGE
INTRAMUSCULAR | Status: AC
  Filled 2019-10-04: qty 5

## 2019-10-04 MED ORDER — MIDAZOLAM HCL 2 MG/2ML IJ SOLN
1.0000 mg | INTRAMUSCULAR | Status: DC | PRN
  Administered 2019-10-04: 2 mg via INTRAVENOUS

## 2019-10-04 MED ORDER — PROPOFOL 500 MG/50ML IV EMUL
INTRAVENOUS | Status: AC
  Filled 2019-10-04: qty 50

## 2019-10-04 MED ORDER — ROPIVACAINE HCL 5 MG/ML IJ SOLN
INTRAMUSCULAR | Status: DC | PRN
  Administered 2019-10-04: 30 mL via PERINEURAL

## 2019-10-04 MED ORDER — FENTANYL CITRATE (PF) 100 MCG/2ML IJ SOLN
50.0000 ug | INTRAMUSCULAR | Status: DC | PRN
  Administered 2019-10-04: 50 ug via INTRAVENOUS

## 2019-10-04 MED ORDER — ONDANSETRON HCL 4 MG/2ML IJ SOLN
INTRAMUSCULAR | Status: AC
  Filled 2019-10-04: qty 2

## 2019-10-04 MED ORDER — LACTATED RINGERS IV SOLN: INTRAVENOUS | Status: DC

## 2019-10-04 MED ORDER — PROPOFOL 10 MG/ML IV BOLUS
INTRAVENOUS | Status: DC | PRN
  Administered 2019-10-04: 20 mg via INTRAVENOUS
  Administered 2019-10-04: 40 mg via INTRAVENOUS

## 2019-10-04 MED ORDER — FENTANYL CITRATE (PF) 100 MCG/2ML IJ SOLN: 25.0000 ug | INTRAMUSCULAR | Status: DC | PRN

## 2019-10-04 MED ORDER — ONDANSETRON HCL 4 MG/2ML IJ SOLN: 4.0000 mg | Freq: Once | INTRAMUSCULAR | Status: DC | PRN

## 2019-10-04 MED ORDER — ONDANSETRON HCL 4 MG/2ML IJ SOLN
INTRAMUSCULAR | Status: DC | PRN
  Administered 2019-10-04: 4 mg via INTRAVENOUS

## 2019-10-04 MED ORDER — BUPIVACAINE HCL (PF) 0.25 % IJ SOLN
INTRAMUSCULAR | Status: AC
  Filled 2019-10-04: qty 30

## 2019-10-04 SURGICAL SUPPLY — 81 items
APL SKNCLS STERI-STRIP NONHPOA (GAUZE/BANDAGES/DRESSINGS) ×1
BAG DECANTER FOR FLEXI CONT (MISCELLANEOUS) IMPLANT
BENZOIN TINCTURE PRP APPL 2/3 (GAUZE/BANDAGES/DRESSINGS) ×2 IMPLANT
BLADE MINI RND TIP GREEN BEAV (BLADE) IMPLANT
BLADE OSCIL/SAGITTAL W/10 ST (BLADE) IMPLANT
BLADE OSCIL/SAGITTAL W/10MM ST (BLADE)
BLADE SURG 15 STRL LF DISP TIS (BLADE) ×1 IMPLANT
BLADE SURG 15 STRL SS (BLADE) ×3
BNDG CMPR 9X4 STRL LF SNTH (GAUZE/BANDAGES/DRESSINGS) ×1
BNDG ELASTIC 2X5.8 VLCR STR LF (GAUZE/BANDAGES/DRESSINGS) IMPLANT
BNDG ELASTIC 3X5.8 VLCR STR LF (GAUZE/BANDAGES/DRESSINGS) ×2 IMPLANT
BNDG ELASTIC 4X5.8 VLCR STR LF (GAUZE/BANDAGES/DRESSINGS) ×5 IMPLANT
BNDG ESMARK 4X9 LF (GAUZE/BANDAGES/DRESSINGS) ×2 IMPLANT
BNDG GAUZE ELAST 4 BULKY (GAUZE/BANDAGES/DRESSINGS) ×3 IMPLANT
CANISTER SUCT 1200ML W/VALVE (MISCELLANEOUS) IMPLANT
CLOSURE WOUND 1/2 X4 (GAUZE/BANDAGES/DRESSINGS) ×1
CMPNT CRPLCNTR 35X23X15X (Orthopedic Implant) ×1 IMPLANT
COMPONENT CRPLCNTR 35X23X15X (Orthopedic Implant) IMPLANT
CORD BIPOLAR FORCEPS 12FT (ELECTRODE) ×3 IMPLANT
COVER BACK TABLE REUSABLE LG (DRAPES) ×3 IMPLANT
COVER WAND RF STERILE (DRAPES) ×2 IMPLANT
CUFF TOURN SGL QUICK 18X4 (TOURNIQUET CUFF) ×2 IMPLANT
DECANTER SPIKE VIAL GLASS SM (MISCELLANEOUS) ×2 IMPLANT
DRAPE EXTREMITY T 121X128X90 (DISPOSABLE) ×3 IMPLANT
DRAPE HALF SHEET 70X43 (DRAPES) ×3 IMPLANT
DRAPE OEC MINIVIEW 54X84 (DRAPES) ×3 IMPLANT
DRAPE SURG 17X23 STRL (DRAPES) ×3 IMPLANT
DURAPREP 26ML APPLICATOR (WOUND CARE) ×3 IMPLANT
ELECT REM PT RETURN 9FT ADLT (ELECTROSURGICAL)
ELECTRODE REM PT RTRN 9FT ADLT (ELECTROSURGICAL) IMPLANT
GAUZE 4X4 16PLY RFD (DISPOSABLE) ×2 IMPLANT
GAUZE SPONGE 4X4 12PLY STRL (GAUZE/BANDAGES/DRESSINGS) ×3 IMPLANT
GAUZE XEROFORM 1X8 LF (GAUZE/BANDAGES/DRESSINGS) IMPLANT
GLOVE SURG SYN 8.0 (GLOVE) ×6 IMPLANT
GLOVE SURG SYN 8.0 PF PI (GLOVE) ×2 IMPLANT
GOWN STRL REIN XL XLG (GOWN DISPOSABLE) ×6 IMPLANT
GOWN STRL REUS W/ TWL LRG LVL3 (GOWN DISPOSABLE) ×1 IMPLANT
GOWN STRL REUS W/TWL LRG LVL3 (GOWN DISPOSABLE) ×3
IMPL CAPITATE TAPR POST 7.5 (Orthopedic Implant) IMPLANT
IMPLANT CAPITATE COMP 35X23 (Orthopedic Implant) ×3 IMPLANT
IMPLANT CAPITATE TAPR POST 7.5 (Orthopedic Implant) ×3 IMPLANT
NDL HYPO 25X1 1.5 SAFETY (NEEDLE) ×1 IMPLANT
NEEDLE HYPO 25X1 1.5 SAFETY (NEEDLE) ×3 IMPLANT
NS IRRIG 1000ML POUR BTL (IV SOLUTION) ×3 IMPLANT
PACK BASIN DAY SURGERY FS (CUSTOM PROCEDURE TRAY) ×3 IMPLANT
PAD CAST 3X4 CTTN HI CHSV (CAST SUPPLIES) ×1 IMPLANT
PAD CAST 4YDX4 CTTN HI CHSV (CAST SUPPLIES) IMPLANT
PADDING CAST ABS 4INX4YD NS (CAST SUPPLIES) ×2
PADDING CAST ABS COTTON 4X4 ST (CAST SUPPLIES) ×1 IMPLANT
PADDING CAST COTTON 3X4 STRL (CAST SUPPLIES) ×3
PADDING CAST COTTON 4X4 STRL (CAST SUPPLIES) ×3
PADDING UNDERCAST 2 STRL (CAST SUPPLIES) ×2
PADDING UNDERCAST 2X4 STRL (CAST SUPPLIES) IMPLANT
PENCIL SMOKE EVACUATOR (MISCELLANEOUS) IMPLANT
SLING ARM FOAM STRAP XLG (SOFTGOODS) ×2 IMPLANT
SPLINT PLASTER CAST XFAST 4X15 (CAST SUPPLIES) ×5 IMPLANT
SPLINT PLASTER XTRA FAST SET 4 (CAST SUPPLIES) ×10
STOCKINETTE 4X48 STRL (DRAPES) ×3 IMPLANT
STRIP CLOSURE SKIN 1/2X4 (GAUZE/BANDAGES/DRESSINGS) ×1 IMPLANT
SUCTION FRAZIER HANDLE 10FR (MISCELLANEOUS) ×2
SUCTION TUBE FRAZIER 10FR DISP (MISCELLANEOUS) IMPLANT
SUT 0 FIBERLOOP 38 BLUE TPR ND (SUTURE) ×6
SUT ETHIBOND 3-0 V-5 (SUTURE) IMPLANT
SUT ETHILON 4 0 PS 2 18 (SUTURE) IMPLANT
SUT MERSILENE 4 0 P 3 (SUTURE) IMPLANT
SUT PROLENE 3 0 PS 2 (SUTURE) IMPLANT
SUT SILK 2 0 PERMA HAND 18 BK (SUTURE) IMPLANT
SUT SILK 4 0 PS 2 (SUTURE) IMPLANT
SUT VIC AB 3-0 FS2 27 (SUTURE) ×2 IMPLANT
SUT VIC AB 4-0 P-3 18XBRD (SUTURE) IMPLANT
SUT VIC AB 4-0 P3 18 (SUTURE)
SUT VICRYL RAPIDE 4-0 (SUTURE) IMPLANT
SUT VICRYL RAPIDE 4/0 PS 2 (SUTURE) IMPLANT
SUTURE 0 FIBERLP 38 BLU TPR ND (SUTURE) IMPLANT
SYR 10ML LL (SYRINGE) ×3 IMPLANT
SYR BULB 3OZ (MISCELLANEOUS) ×3 IMPLANT
TOWEL GREEN STERILE FF (TOWEL DISPOSABLE) ×3 IMPLANT
TUBE CONNECTING 20'X1/4 (TUBING) ×1
TUBE CONNECTING 20X1/4 (TUBING) ×1 IMPLANT
UNDERPAD 30X36 HEAVY ABSORB (UNDERPADS AND DIAPERS) ×3 IMPLANT
wrist in motion kwire IMPLANT

## 2019-10-04 NOTE — H&P (Signed)
Joe Todd is an 63 y.o. male.   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  Past Medical History:  Diagnosis Date  . Diabetes mellitus without complication ([place])   . NSAID long-term use 01/23/2017   Advised he try to use Tylenol 1000 mg q8 to avoid long term complications. He is currently taking 800 mg in the morning and two aleve PM at night.     Past Surgical History:  Procedure Laterality Date  . Bilateral shoulder surgery    . HAND SURGERY    . HERNIA REPAIR    . 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 Cancer Mother   . Hyperlipidemia Father   . Hypertension Father   . Heart disease Father   . Diabetes Father    Social History:  reports that he has never smoked. He has never used smokeless tobacco. He reports previous alcohol use. He reports that he does not use drugs.  Allergies:  Allergies  Allergen Reactions  . Penicillins Other (See Comments)    Not remember    Medications Prior to Admission  Medication Sig Dispense Refill  . gabapentin (NEURONTIN) 100 MG capsule Take 1 capsule (100 mg total) by mouth at bedtime. 90 capsule 3  . Glucosamine-Chondroitin (MOVE FREE PO) Take by mouth.    Marland Kitchen ibuprofen (ADVIL) 200 MG tablet Take 200 mg by mouth every 6 (six) hours as needed.    Marland Kitchen L-Lysine 1000 MG TABS Take by mouth.    . metFORMIN (GLUCOPHAGE) 500 MG tablet Take 1 tablet (500 mg total) by mouth 3 (three) times daily. 270 tablet 0  . Multiple Vitamins-Minerals (MENS 50+ MULTI VITAMIN/MIN PO) Take by mouth daily.    . ONE TOUCH LANCETS MISC Use once a day: One Touch Verio flex 100 each 11  . Zinc Sulfate (ZINC 15 PO) Take by mouth.      Results for orders placed or performed during the hospital encounter of  10/04/19 (from the past 48 hour(s))  Glucose, capillary     Status: Abnormal   Collection Time: 10/04/19 11:41 AM  Result Value Ref Range   Glucose-Capillary 105 (H) 70 - 99 mg/dL   No results found.  Review of Systems  All other systems reviewed and are negative.   Blood pressure 139/89, pulse 73, temperature 97.8 F (36.6 C), temperature source Oral, resp. rate 12, height 6' (1.829 m), weight 115.7 kg, SpO2 99 %. Physical Exam  Constitutional: He is oriented to person, place, and time. He appears well-developed and well-nourished.  HENT:  Head: Normocephalic and atraumatic.  Cardiovascular: Normal rate.  Respiratory: Effort normal.  Musculoskeletal:     Left wrist: Swelling, tenderness and bony tenderness present.     Cervical back: Normal range of motion.     Comments: Left wrist pain, swelling, and decreased range of motion  Neurological: He is alert and oriented to person, place, and time.  Psychiatric: He has a normal mood and affect. His behavior is normal. Judgment and thought content normal.     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  Have discussed the role of proximal row carpectomy with probable capitate resurfacing arthroplasty as an outpatient under regional anesthetic.  Patient understands risks and benefits and  the 3 to 43-month recovery.  And wishes to proceed  Schuyler Amor, MD 10/04/2019, 12:25 PM

## 2019-10-04 NOTE — Transfer of Care (Signed)
Immediate Anesthesia Transfer of Care Note  Patient: Joe Todd  Procedure(s) Performed: LEFT WRIST PROXIMAL ROW CARPECTOMY, CAPITATE RESURFACING ARTHROPLASTY (Left Wrist)  Patient Location: PACU  Anesthesia Type:MAC combined with regional for post-op pain  Level of Consciousness: awake and alert   Airway & Oxygen Therapy: Patient Spontanous Breathing and Patient connected to face mask oxygen  Post-op Assessment: Report given to RN and Post -op Vital signs reviewed and stable  Post vital signs: Reviewed and stable  Last Vitals:  Vitals Value Taken Time  BP 125/81 10/04/19 1520  Temp    Pulse 55 10/04/19 1522  Resp 18 10/04/19 1522  SpO2 99 % 10/04/19 1522  Vitals shown include unvalidated device data.  Last Pain:  Vitals:   10/04/19 1147  TempSrc: Oral  PainSc: 0-No pain      Patients Stated Pain Goal: 3 (47/42/59 5638)  Complications: No apparent anesthesia complications

## 2019-10-04 NOTE — Progress Notes (Signed)
Assisted Dr. Foster with left, ultrasound guided, axillary block. Side rails up, monitors on throughout procedure. See vital signs in flow sheet. Tolerated Procedure well. 

## 2019-10-04 NOTE — Op Note (Signed)
Patient was taken to the operating suite and after induction of adequate regional anesthetic and IV sedation the left upper extremity was prepped and draped in the usual sterile fashion.  An Esmarch was used to exsanguinate the limb and the tourniquet was inflated to 250 mmHg.  At this point time incision was made over the dorsal aspect of the left wrist and dissection was carried down to the interval between the second third and fourth dorsal compartments.  Third dorsal compartment was identified and released and the EPL tendon was retracted to the lateral side.  We retracted the second dorsal compartment radially and the fourth dorsal compartment meant ulnarly.  We then incised the capsule over this area and dissected down to the floor the fourth dorsal compartment.  The posterior osseous nerve is carefully identified and a neurectomy was performed.  We then elevated dorsal and correction radial and ulnar flaps and dissected down to the proximal row.  The proximal row was moved in its entirety using a combination of curettes, osteotomes, and rongeurs.  After the proximal row was removed fluoroscopic imaging revealed removal of the osseous debris and then a small radial styloidectomy was performed under direct and fluoroscopic imaging.  Once this was done using the arthrosphere capitate resurfacing are thorough plan system the head of the capitate was resurfaced using a 15 x 23 x 35 implant placed as per protocol.  We then reduced the implant into the lunate fossa.  The wound was irrigated and loosely closed in layers of 0 FiberWire to close the capsule followed by 3-0 Vicryl subcutaneously and a 3-0 Prolene subcuticular stitch on the skin.  Steri-Strips, 4 x 4's, and a sugar tong splint was applied.  Fluoroscopic imaging revealed good placement of the capitate head and the lunate fossa on AP and lateral views.  The patient went to recovery room in stable fashion.

## 2019-10-04 NOTE — Brief Op Note (Signed)
10/04/2019  3:00 PM  PATIENT:  Joe Todd  63 y.o. male  PRE-OPERATIVE DIAGNOSIS:  left wrist arthritis  POST-OPERATIVE DIAGNOSIS:  left wrist arthritis  PROCEDURE:  Procedure(s) with comments: LEFT WRIST PROXIMAL ROW CARPECTOMY, CAPITATE RESURFACING ARTHROPLASTY (Left) - AXILLARY BLOCK  SURGEON:  Surgeon(s) and Role:    Charlotte Crumb, MD - Primary  PHYSICIAN ASSISTANT:   ASSISTANTS: Leverne Humbles PA  ANESTHESIA:   regional  EBL: Minimal  BLOOD ADMINISTERED:none  DRAINS: none   LOCAL MEDICATIONS USED:  NONE  SPECIMEN:  No Specimen  DISPOSITION OF SPECIMEN:  N/A  COUNTS:  YES  TOURNIQUET:  * Missing tourniquet times found for documented tourniquets in log: [id number] *  DICTATION: .Viviann Spare Dictation  PLAN OF CARE: Discharge to home after PACU  PATIENT DISPOSITION:  PACU - hemodynamically stable.   Delay start of Pharmacological VTE agent (>24hrs) due to surgical blood loss or risk of bleeding: not applicable

## 2019-10-04 NOTE — Anesthesia Procedure Notes (Signed)
Anesthesia Regional Block: Axillary brachial plexus block   Pre-Anesthetic Checklist: ,, timeout performed, Correct Patient, Correct Site, Correct Laterality, Correct Procedure, Correct Position, site marked, Risks and benefits discussed,  Surgical consent,  Pre-op evaluation,  At surgeon's request and post-op pain management  Laterality: Left  Prep: chloraprep       Needles:  Injection technique: Single-shot  Needle Type: Echogenic Stimulator Needle     Needle Length: 9cm  Needle Gauge: 21   Needle insertion depth: 6 cm   Additional Needles:   Procedures:,,,, ultrasound used (permanent image in chart),,,,  Narrative:  Start time: 10/04/2019 12:06 PM End time: 10/04/2019 12:11 PM Injection made incrementally with aspirations every 5 mL.  Performed by: Personally  Anesthesiologist: Josephine Igo, MD  Additional Notes: Timeout performed. Patient sedated. Relevant anatomy ID'd using [place]. Incremental 2-37ml injection of LA with frequent aspiration. Patient tolerated procedure well.        Left Axillary Block

## 2019-10-04 NOTE — Discharge Instructions (Signed)
  Post Anesthesia Home Care Instructions  Activity: Get plenty of rest for the remainder of the day. A responsible individual must stay with you for 24 hours following the procedure.  For the next 24 hours, DO NOT: -Drive a car -Paediatric nurse -Drink alcoholic beverages -Take any medication unless instructed by your physician -Make any legal decisions or sign important papers.  Meals: Start with liquid foods such as gelatin or soup. Progress to regular foods as tolerated. Avoid greasy, spicy, heavy foods. If nausea and/or vomiting occur, drink only clear liquids until the nausea and/or vomiting subsides. Call your physician if vomiting continues.  Special Instructions/Symptoms: Your throat may feel dry or sore from the anesthesia or the breathing tube placed in your throat during surgery. If this causes discomfort, gargle with warm salt water. The discomfort should disappear within 24 hours.  If you had a scopolamine patch placed behind your ear for the management of post- operative nausea and/or vomiting:  1. The medication in the patch is effective for 72 hours, after which it should be removed.  Wrap patch in a tissue and discard in the trash. Wash hands thoroughly with soap and water. 2. You may remove the patch earlier than 72 hours if you experience unpleasant side effects which may include dry mouth, dizziness or visual disturbances. 3. Avoid touching the patch. Wash your hands with soap and water after contact with the patch.       Regional Anesthesia Blocks  1. Numbness or the inability to move the "blocked" extremity may last from 3-48 hours after placement. The length of time depends on the medication injected and your individual response to the medication. If the numbness is not going away after 48 hours, call your surgeon.  2. The extremity that is blocked will need to be protected until the numbness is gone and the  Strength has returned. Because you cannot feel it, you  will need to take extra care to avoid injury. Because it may be weak, you may have difficulty moving it or using it. You may not know what position it is in without looking at it while the block is in effect.  3. For blocks in the legs and feet, returning to weight bearing and walking needs to be done carefully. You will need to wait until the numbness is entirely gone and the strength has returned. You should be able to move your leg and foot normally before you try and bear weight or walk. You will need someone to be with you when you first try to ensure you do not fall and possibly risk injury.  4. Bruising and tenderness at the needle site are common side effects and will resolve in a few days.  5. Persistent numbness or new problems with movement should be communicated to the surgeon or the Cook [phone] [place] [phone]).    Call your surgeon if you experience:   1.  Fever over 101.0. 2.  Inability to urinate. 3.  Nausea and/or vomiting. 4.  Extreme swelling or bruising at the surgical site. 5.  Continued bleeding from the incision. 6.  Increased pain, redness or drainage from the incision. 7.  Problems related to your pain medication. 8.  Any problems and/or concerns

## 2019-10-04 NOTE — Anesthesia Preprocedure Evaluation (Addendum)
Anesthesia Evaluation  Patient identified by MRN, date of birth, ID band Patient awake    Reviewed: Allergy & Precautions, NPO status , Patient's Chart, lab work & pertinent test results  Airway Mallampati: II  TM Distance: >3 FB Neck ROM: Full    Dental no notable dental hx. (+) Teeth Intact   Pulmonary neg pulmonary ROS,    Pulmonary exam normal breath sounds clear to auscultation       Cardiovascular negative cardio ROS Normal cardiovascular exam Rhythm:Regular Rate:Normal     Neuro/Psych negative neurological ROS  negative psych ROS   GI/Hepatic negative GI ROS, Neg liver ROS,   Endo/Other  diabetes, Well Controlled, Type 2, Oral Hypoglycemic AgentsObesity  Renal/GU negative Renal ROS   ED    Musculoskeletal Arthralgia left wrist    Abdominal (+) + obese,   Peds  Hematology negative hematology ROS (+)   Anesthesia Other Findings   Reproductive/Obstetrics                            Anesthesia Physical Anesthesia Plan  ASA: III  Anesthesia Plan: MAC and Regional   Post-op Pain Management:    Induction: Intravenous  PONV Risk Score and Plan: 1 and Ondansetron and Treatment may vary due to age or medical condition  Airway Management Planned: Nasal Cannula, Natural Airway and Simple Face Mask  Additional Equipment:   Intra-op Plan:   Post-operative Plan:   Informed Consent: I have reviewed the patients History and Physical, chart, labs and discussed the procedure including the risks, benefits and alternatives for the proposed anesthesia with the patient or authorized representative who has indicated his/her understanding and acceptance.     Dental advisory given  Plan Discussed with: CRNA and Surgeon  Anesthesia Plan Comments:         Anesthesia Quick Evaluation

## 2019-10-04 NOTE — Anesthesia Postprocedure Evaluation (Signed)
Anesthesia Post Note  Patient: Joe Todd  Procedure(s) Performed: LEFT WRIST PROXIMAL ROW CARPECTOMY, CAPITATE RESURFACING ARTHROPLASTY (Left Wrist)     Patient location during evaluation: PACU Anesthesia Type: Regional and MAC Level of consciousness: awake and alert Pain management: pain level controlled Vital Signs Assessment: post-procedure vital signs reviewed and stable Respiratory status: spontaneous breathing and respiratory function stable Cardiovascular status: stable Postop Assessment: no apparent nausea or vomiting Anesthetic complications: no    Last Vitals:  Vitals:   10/04/19 1545 10/04/19 1600  BP: (!) 139/91 140/79  Pulse: (!) 53 (!) 52  Resp: 12 15  Temp:    SpO2: 96% 96%    Last Pain:  Vitals:   10/04/19 1600  TempSrc:   PainSc: 0-No pain                 Sheldon Sem,Antonia DANIEL

## 2019-10-08 NOTE — Telephone Encounter (Signed)
I called and tried to explain it to the patient. He did not agree with my explanation. He stated that his insurance told him that it was coded incorrectly. He is going to call his insurance back to dispute.

## 2019-10-09 ENCOUNTER — Encounter: Payer: Self-pay | Admitting: *Deleted

## 2019-10-14 ENCOUNTER — Telehealth: Payer: Self-pay | Admitting: Family Medicine

## 2019-10-14 NOTE — Telephone Encounter (Signed)
Patient came in the office and would like for now on that his labs be sent to only [facility] since they are paid 100% thru his insurance.

## 2019-12-31 ENCOUNTER — Ambulatory Visit: Payer: 59 | Admitting: Family Medicine

## 2020-02-04 ENCOUNTER — Telehealth (INDEPENDENT_AMBULATORY_CARE_PROVIDER_SITE_OTHER): Payer: 59 | Admitting: Family Medicine

## 2020-02-04 ENCOUNTER — Encounter: Payer: Self-pay | Admitting: Family Medicine

## 2020-02-04 VITALS — BP 134/88 | HR 68 | Ht 72.0 in | Wt 259.0 lb

## 2020-02-04 DIAGNOSIS — K529 Noninfective gastroenteritis and colitis, unspecified: Secondary | ICD-10-CM

## 2020-02-04 NOTE — Progress Notes (Signed)
   Joe Todd is a 64 y.o. male who presents today for a virtual office visit.  Assessment/Plan:  New/Acute Problems: Gastroenteritis Symptoms consistent months viral gastroenteritis.  Currently appears well with no red flags.  No peritoneal signs.  Will treat conservatively with good oral hydration.  Discussed importance of electrolyte replacement as well.  Recommended Pepto-Bismol 4 times daily as needed.  He can use Tylenol or ibuprofen as needed for headaches.  Anticipate improvement over the next several days.  If not improving would consider stool studies.  Discussed reasons to return to care.  Follow-up as needed.    Subjective:  HPI:  Symptoms started 2-3 days ago with diarrhea and fatigue.  His wife has been sick with similar symptoms.  He had one episode of dark stool but since then has been mostly watery and soft.  Is been having some abdominal pain cramping as well.  He feels very tired also is having some headache.  He has been try to keep up with fluids and thinks he is doing a good job with oral hydration.  No nausea or vomiting.  Some chills.  No fevers.  No treatments tried.       Objective/Observations  Physical Exam: Gen: NAD, resting comfortably Pulm: Normal work of breathing Neuro: Grossly normal, moves all extremities Psych: Normal affect and thought content  Virtual Visit via Video   I connected with Joe Todd on 02/04/20 at 11:40 AM EDT by a video enabled telemedicine application and verified that I am speaking with the correct person using two identifiers. The limitations of evaluation and management by telemedicine and the availability of in person appointments were discussed. The patient expressed understanding and agreed to proceed.   Patient location: Home Provider location: [place] participating in the virtual visit: Myself and Patient     Algis Greenhouse. Jerline Pain, MD 02/04/2020 12:04 PM

## 2021-11-20 ENCOUNTER — Other Ambulatory Visit: Payer: Self-pay

## 2021-11-20 ENCOUNTER — Encounter (HOSPITAL_BASED_OUTPATIENT_CLINIC_OR_DEPARTMENT_OTHER): Payer: Self-pay | Admitting: Emergency Medicine

## 2021-11-20 ENCOUNTER — Emergency Department (HOSPITAL_BASED_OUTPATIENT_CLINIC_OR_DEPARTMENT_OTHER)
Admission: EM | Admit: 2021-11-20 | Discharge: 2021-11-20 | Disposition: A | Discharge status: Home / Self Care | Payer: 59 | Attending: Emergency Medicine | Admitting: Emergency Medicine

## 2021-11-20 ENCOUNTER — Emergency Department (HOSPITAL_BASED_OUTPATIENT_CLINIC_OR_DEPARTMENT_OTHER): Payer: 59

## 2021-11-20 DIAGNOSIS — Z7984 Long term (current) use of oral hypoglycemic drugs: Secondary | ICD-10-CM | POA: Insufficient documentation

## 2021-11-20 DIAGNOSIS — R1032 Left lower quadrant pain: Secondary | ICD-10-CM | POA: Diagnosis present

## 2021-11-20 DIAGNOSIS — E119 Type 2 diabetes mellitus without complications: Secondary | ICD-10-CM | POA: Insufficient documentation

## 2021-11-20 DIAGNOSIS — R109 Unspecified abdominal pain: Secondary | ICD-10-CM

## 2021-11-20 LAB — CBC WITH DIFFERENTIAL/PLATELET
Abs Immature Granulocytes: 0.01 10*3/uL (ref 0.00–0.07)
Basophils Absolute: 0 10*3/uL (ref 0.0–0.1)
Basophils Relative: 1 %
Eosinophils Absolute: 0.2 10*3/uL (ref 0.0–0.5)
Eosinophils Relative: 4 %
HCT: 42.7 % (ref 39.0–52.0)
Hemoglobin: 14.1 g/dL (ref 13.0–17.0)
Immature Granulocytes: 0 %
Lymphocytes Relative: 22 %
Lymphs Abs: 1.2 10*3/uL (ref 0.7–4.0)
MCH: 31.7 pg (ref 26.0–34.0)
MCHC: 33 g/dL (ref 30.0–36.0)
MCV: 96 fL (ref 80.0–100.0)
Monocytes Absolute: 0.6 10*3/uL (ref 0.1–1.0)
Monocytes Relative: 10 %
Neutro Abs: 3.6 10*3/uL (ref 1.7–7.7)
Neutrophils Relative %: 63 %
Platelets: 244 10*3/uL (ref 150–400)
RBC: 4.45 MIL/uL (ref 4.22–5.81)
RDW: 13.3 % (ref 11.5–15.5)
WBC: 5.6 10*3/uL (ref 4.0–10.5)
nRBC: 0 % (ref 0.0–0.2)

## 2021-11-20 LAB — URINALYSIS, ROUTINE W REFLEX MICROSCOPIC
Bilirubin Urine: NEGATIVE
Glucose, UA: NEGATIVE mg/dL
Hgb urine dipstick: NEGATIVE
Ketones, ur: NEGATIVE mg/dL
Leukocytes,Ua: NEGATIVE
Nitrite: NEGATIVE
Protein, ur: NEGATIVE mg/dL
Specific Gravity, Urine: 1.019 (ref 1.005–1.030)
pH: 6 (ref 5.0–8.0)

## 2021-11-20 LAB — BASIC METABOLIC PANEL
Anion gap: 7 (ref 5–15)
BUN: 16 mg/dL (ref 8–23)
CO2: 29 mmol/L (ref 22–32)
Calcium: 9.2 mg/dL (ref 8.9–10.3)
Chloride: 103 mmol/L (ref 98–111)
Creatinine, Ser: 0.96 mg/dL (ref 0.61–1.24)
GFR, Estimated: >60 mL/min (ref >60)
Glucose, Bld: 161 mg/dL — ABNORMAL HIGH (ref 70–99)
Potassium: 4.4 mmol/L (ref 3.5–5.1)
Sodium: 139 mmol/L (ref 135–145)

## 2021-11-20 MED ORDER — METHOCARBAMOL 500 MG PO TABS
500.0000 mg | ORAL_TABLET | Freq: Once | ORAL | Status: AC
  Administered 2021-11-20: 500 mg via ORAL
  Filled 2021-11-20: qty 1

## 2021-11-20 MED ORDER — SODIUM CHLORIDE 0.9 % IV BOLUS
500.0000 mL | Freq: Once | INTRAVENOUS | Status: AC
  Administered 2021-11-20: 500 mL via INTRAVENOUS

## 2021-11-20 MED ORDER — ONDANSETRON HCL 4 MG/2ML IJ SOLN
4.0000 mg | Freq: Once | INTRAMUSCULAR | Status: AC
  Administered 2021-11-20: 4 mg via INTRAVENOUS
  Filled 2021-11-20: qty 2

## 2021-11-20 MED ORDER — LIDOCAINE 5 % EX PTCH
2.0000 | MEDICATED_PATCH | CUTANEOUS | Status: DC
  Administered 2021-11-20: 2 via TRANSDERMAL
  Filled 2021-11-20: qty 2

## 2021-11-20 MED ORDER — LIDOCAINE 5 % EX PTCH: 1.0000 | MEDICATED_PATCH | CUTANEOUS | 0 refills | Status: DC

## 2021-11-20 MED ORDER — KETOROLAC TROMETHAMINE 30 MG/ML IJ SOLN
30.0000 mg | Freq: Once | INTRAMUSCULAR | Status: AC
  Administered 2021-11-20: 30 mg via INTRAVENOUS
  Filled 2021-11-20: qty 1

## 2021-11-20 MED ORDER — DICLOFENAC SODIUM 1 % EX GEL: 4.0000 g | Freq: Four times a day (QID) | CUTANEOUS | 0 refills | Status: DC

## 2021-11-20 MED ORDER — METAXALONE 800 MG PO TABS: 800.0000 mg | ORAL_TABLET | Freq: Three times a day (TID) | ORAL | 0 refills | Status: DC

## 2021-11-20 NOTE — Discharge Instructions (Addendum)
Your urine did not show any signs of infection.  Most likely you have a muscular strain.  Please follow-up with your family doctor in the office.  Return for worsening pain fever or inability to eat or drink.  Return for difficulty urinating if your leg is numb or weak.

## 2021-11-20 NOTE — ED Triage Notes (Signed)
°  Patient comes in with LLQ pain that has been going on for 2 days.  Patient states pain starts in lower back/flank area and wraps around to L front of lower abdomen.  No fevers that he is aware of.  No painful urination.  Pain 8/10, sharp/tender in LLQ.  Took 2 Aleve and 2 metformin around 0500 this morning.

## 2021-11-20 NOTE — ED Provider Notes (Signed)
I received the patient in signout from Dr. Randal Buba, briefly the patient is a 66 year old male with flank pain.  Stone study negative for obvious acute pathology.  Presumed to be musculoskeletal based on history and physical.  Awaiting UA to assess for urinary tract infection.  If negative plan to discharge home with symptomatic therapy if positive recommended antibiotic therapy.  UA has resulted and by my independent interpretation does not suggest infection.  Will discharge home.   Deno Etienne, DO 11/20/21 1048

## 2021-11-20 NOTE — ED Provider Notes (Signed)
[facility] Provider Note   CSN: [id number] Arrival date & time: 11/20/21  0549     History  Chief Complaint  Patient presents with   Abdominal Pain    Joe Todd is a 66 y.o. male.  The history is provided by the patient.  Abdominal Pain Pain location:  L flank Pain quality: aching and cramping   Pain radiates to:  LLQ Pain severity:  Severe Onset quality:  Gradual Duration:  2 days Timing:  Constant Progression:  Worsening Chronicity:  New Context: not sick contacts and not suspicious food intake   Relieved by:  Nothing Worsened by:  Nothing Ineffective treatments:  None tried (heat therapy) Associated symptoms: no anorexia, no constipation, no diarrhea, no dysuria, no fever, no flatus, no nausea and no vomiting   Risk factors: no alcohol abuse and no recent hospitalization     Past Medical History:  Diagnosis Date   Diabetes mellitus without complication ([place])    NSAID long-term use 01/23/2017   Advised he try to use Tylenol 1000 mg q8 to avoid long term complications. He is currently taking 800 mg in the morning and two aleve PM at night.        Home Medications Prior to Admission medications   Medication Sig Start Date End Date Taking? Authorizing Provider  atorvastatin (LIPITOR) 10 MG tablet Take 10 mg by mouth daily. 11/22/19   [provider]  gabapentin (NEURONTIN) 100 MG capsule Take 1 capsule (100 mg total) by mouth at bedtime. 08/02/19   Vivi Barrack, MD  Glucosamine-Chondroitin (MOVE FREE PO) Take by mouth.    [provider]  ibuprofen (ADVIL) 200 MG tablet Take 200 mg by mouth every 6 (six) hours as needed.    [provider]  L-Lysine 1000 MG TABS Take by mouth.    [provider]  metFORMIN (GLUCOPHAGE) 500 MG tablet Take 1 tablet (500 mg total) by mouth 3 (three) times daily. 07/04/19   Vivi Barrack, MD  Multiple Vitamins-Minerals (MENS 50+ MULTI VITAMIN/MIN PO) Take by mouth  daily.    [provider]  ONE TOUCH LANCETS MISC Use once a day: One Touch Verio flex 06/08/18   Tereasa Coop, PA-C  Zinc Sulfate (ZINC 15 PO) Take by mouth.    [provider]      Allergies    Penicillins    Review of Systems   Review of Systems  Constitutional:  Negative for fever.  HENT:  Negative for facial swelling.   Eyes:  Negative for redness.  Respiratory:  Negative for wheezing and stridor.   Cardiovascular:  Negative for leg swelling.  Gastrointestinal:  Positive for abdominal pain. Negative for anorexia, constipation, diarrhea, flatus, nausea and vomiting.  Genitourinary:  Positive for flank pain. Negative for dysuria.  Neurological:  Negative for facial asymmetry.  Psychiatric/Behavioral:  Negative for agitation.   All other systems reviewed and are negative.  Physical Exam Updated Vital Signs BP 138/79    Pulse 68    Temp 98.4 F (36.9 C) (Oral)    Resp 18    Ht 6' (1.829 m)    Wt 122 kg    SpO2 98%    BMI 36.48 kg/m  Physical Exam Vitals and nursing note reviewed. Exam conducted with a chaperone present.  Constitutional:      Appearance: Normal appearance. He is not diaphoretic.  HENT:     Head: Normocephalic and atraumatic.     Nose: Nose normal.  Eyes:     Conjunctiva/sclera: Conjunctivae normal.     Pupils: Pupils are equal, round, and reactive to light.  Cardiovascular:     Rate and Rhythm: Normal rate and regular rhythm.     Pulses: Normal pulses.     Heart sounds: Normal heart sounds.  Pulmonary:     Effort: Pulmonary effort is normal.     Breath sounds: Normal breath sounds.  Abdominal:     General: Bowel sounds are normal.     Palpations: Abdomen is soft.     Tenderness: There is no abdominal tenderness. There is no guarding or rebound.     Hernia: No hernia is present.  Musculoskeletal:        General: Normal range of motion.     Cervical back: Normal range of motion and neck supple.  Skin:    General: Skin is warm and  dry.     Capillary Refill: Capillary refill takes less than 2 seconds.  Neurological:     General: No focal deficit present.     Mental Status: He is alert and oriented to person, place, and time.     Deep Tendon Reflexes: Reflexes normal.  Psychiatric:        Mood and Affect: Mood normal.        Behavior: Behavior normal.    ED Results / Procedures / Treatments   Labs (all labs ordered are listed, but only abnormal results are displayed) Results for orders placed or performed during the hospital encounter of 11/20/21  CBC with Differential/Platelet  Result Value Ref Range   WBC 5.6 4.0 - 10.5 K/uL   RBC 4.45 4.22 - 5.81 MIL/uL   Hemoglobin 14.1 13.0 - 17.0 g/dL   HCT 42.7 39.0 - 52.0 %   MCV 96.0 80.0 - 100.0 fL   MCH 31.7 26.0 - 34.0 pg   MCHC 33.0 30.0 - 36.0 g/dL   RDW 13.3 11.5 - 15.5 %   Platelets 244 150 - 400 K/uL   nRBC 0.0 0.0 - 0.2 %   Neutrophils Relative % 63 %   Neutro Abs 3.6 1.7 - 7.7 K/uL   Lymphocytes Relative 22 %   Lymphs Abs 1.2 0.7 - 4.0 K/uL   Monocytes Relative 10 %   Monocytes Absolute 0.6 0.1 - 1.0 K/uL   Eosinophils Relative 4 %   Eosinophils Absolute 0.2 0.0 - 0.5 K/uL   Basophils Relative 1 %   Basophils Absolute 0.0 0.0 - 0.1 K/uL   Immature Granulocytes 0 %   Abs Immature Granulocytes 0.01 0.00 - 0.07 K/uL   No results found.   Radiology No results found.  Procedures  Medications Ordered in ED Medications  sodium chloride 0.9 % bolus 500 mL (500 mLs Intravenous New Bag/Given 11/20/21 6568)  ketorolac (TORADOL) 30 MG/ML injection 30 mg (30 mg Intravenous Given 11/20/21 0614)  ondansetron (ZOFRAN) injection 4 mg (4 mg Intravenous Given 11/20/21 1275)    ED Course/ Medical Decision Making/ A&P Clinical Course as of 11/20/21 1700  Sat Nov 20, 2021  0628 CT Renal Laren Everts [AP]  1749 CT Renal Laren Everts [AP]    Clinical Course User Index [AP] Veatrice Kells, MD                           Medical Decision Making Flank pain into  the groin x 2 days.  No f/c/r.  No n/v/d.    Amount and/or Complexity of Data  Reviewed External Data Reviewed: labs.    Details: normal electrolytes and cbc Labs: ordered.    Details: normal CBC Radiology: ordered. Decision-making details documented in ED Course.    Details: No perforation by me on CT scan, kidney somewhat atrophic, no stones.  No perforation  Risk Prescription drug management. Decision regarding hospitalization. Risk Details: I considered hospitalization but vitals, exam are normal and reassuring.  There are no signs of systemic infection on CBC.  No kidney stones on CT, no obstructions no colitis or diverticulitis.  I believe this is MSK pain but urin is pending at this time.      Likely discharge once urine has returned.    Final Clinical Impression(s) / ED Diagnoses Final diagnoses:  None   Signed out to Dr. Tyrone Nine pending urinalysis  Rx / DC Orders ED Discharge Orders     None         Syler Norcia, MD 11/20/21 [phone]

## 2023-02-23 ENCOUNTER — Other Ambulatory Visit: Payer: Self-pay | Admitting: Urology

## 2023-02-23 DIAGNOSIS — R972 Elevated prostate specific antigen [PSA]: Secondary | ICD-10-CM

## 2023-03-06 ENCOUNTER — Other Ambulatory Visit (HOSPITAL_COMMUNITY): Payer: Self-pay | Admitting: Urology

## 2023-03-06 DIAGNOSIS — R972 Elevated prostate specific antigen [PSA]: Secondary | ICD-10-CM

## 2023-03-18 ENCOUNTER — Ambulatory Visit (HOSPITAL_COMMUNITY)
Admission: RE | Admit: 2023-03-18 | Discharge: 2023-03-18 | Disposition: A | Discharge status: Home / Self Care | Payer: 59 | Source: Ambulatory Visit | Attending: Urology | Admitting: Urology

## 2023-03-18 DIAGNOSIS — R972 Elevated prostate specific antigen [PSA]: Secondary | ICD-10-CM | POA: Insufficient documentation

## 2023-03-18 MED ORDER — GADOBUTROL 1 MMOL/ML IV SOLN
10.0000 mL | Freq: Once | INTRAVENOUS | Status: AC | PRN
  Administered 2023-03-18: 10 mL via INTRAVENOUS

## 2023-11-03 ENCOUNTER — Other Ambulatory Visit: Payer: Self-pay | Admitting: Orthopedic Surgery

## 2023-11-07 NOTE — Pre-Procedure Instructions (Signed)
Surgical Instructions    Your procedure is scheduled on Wednesday, January 29th.    Report to [facility] Main Entrance "A" at 10 A.M., then check in with the Admitting office.  Call this number if you have problems the morning of surgery:  [phone]  If you have any questions prior to your surgery date call [phone]: Open Monday-Friday 8am-4pm If you experience any cold or flu symptoms such as cough, fever, chills, shortness of breath, etc. between now and your scheduled surgery, please notify us at the above number.     Remember:  Do not eat after midnight the night before your surgery  You may drink clear liquids until 10 AM the morning of your surgery.   Clear liquids allowed are: Water, Non-Citrus Juices (without pulp), Carbonated Beverages, Clear Tea, Black Coffee Only (NO MILK, CREAM OR POWDERED CREAMER of any kind), and Gatorade.   Patient Instructions  The night before surgery:  No food after midnight. ONLY clear liquids after midnight  The day of surgery (if you have diabetes): Drink ONE (1) 12 oz G2 given to you in your pre admission testing appointment by 10 AM the morning of surgery. Drink in one sitting. Do not sip.  This drink was given to you during your hospital  pre-op appointment visit.  Nothing else to drink after completing the  12 oz bottle of G2.         If you have questions, please contact your surgeon's office.   Take these medicines the morning of surgery with A SIP OF WATER:              acetaminophen (TYLENOL)             methylPREDNISolone (MEDROL)   WHAT DO I DO ABOUT MY DIABETES MEDICATION?   Do not take oral diabetes medicines (pills) the morning of surgery. Last dose of Metformin will be on Tuesday, January 28.   Hold Semaglutide (OZEMPIC) 7 days prior to your surgery. Last dose of Semaglutide [facility]) should be no later than January 21.   The day of surgery, do not take other diabetes injectables, including Byetta (exenatide),  Bydureon (exenatide ER), Victoza (liraglutide), or Trulicity (dulaglutide).  If your CBG is greater than 220 mg/dL, you may take  of your sliding scale (correction) dose of insulin.   HOW TO MANAGE YOUR DIABETES BEFORE AND AFTER SURGERY  Why is it important to control my blood sugar before and after surgery? Improving blood sugar levels before and after surgery helps healing and can limit problems. A way of improving blood sugar control is eating a healthy diet by:  Eating less sugar and carbohydrates  Increasing activity/exercise  Talking with your doctor about reaching your blood sugar goals High blood sugars (greater than 180 mg/dL) can raise your risk of infections and slow your recovery, so you will need to focus on controlling your diabetes during the weeks before surgery. Make sure that the doctor who takes care of your diabetes knows about your planned surgery including the date and location.  How do I manage my blood sugar before surgery? Check your blood sugar at least 4 times a day, starting 2 days before surgery, to make sure that the level is not too high or low.  Check your blood sugar the morning of your surgery when you wake up and every 2 hours until you get to the Short Stay unit.  If your blood sugar is less than 70 mg/dL, you will need to  treat for low blood sugar: Do not take insulin. Treat a low blood sugar (less than 70 mg/dL) with  cup of clear juice (cranberry or apple), 4 glucose tablets, OR glucose gel. Recheck blood sugar in 15 minutes after treatment (to make sure it is greater than 70 mg/dL). If your blood sugar is not greater than 70 mg/dL on recheck, call [phone] for further instructions. Report your blood sugar to the short stay nurse when you get to Short Stay.  If you are admitted to the hospital after surgery: Your blood sugar will be checked by the staff and you will probably be given insulin after surgery (instead of oral diabetes medicines)  to make sure you have good blood sugar levels. The goal for blood sugar control after surgery is 80-180 mg/dL.    As of today, STOP taking any Aspirin (unless otherwise instructed by your surgeon) Aleve, Naproxen, Ibuprofen, Motrin, Advil, Goody's, BC's, all herbal medications, fish oil, and all vitamins.                     Do NOT Smoke (Tobacco/Vaping) for 24 hours prior to your procedure.  If you use a CPAP at night, you may bring your mask/headgear for your overnight stay.   Contacts, glasses, piercing's, hearing aid's, dentures or partials may not be worn into surgery, please bring cases for these belongings.    For patients admitted to the hospital, discharge time will be determined by your treatment team.   Patients discharged the day of surgery will not be allowed to drive home, and someone needs to stay with them for 24 hours.  SURGICAL WAITING ROOM VISITATION Patients having surgery or a procedure may have no more than 2 support people in the waiting area - these visitors may rotate.   Children under the age of 90 must have an adult with them who is not the patient. If the patient needs to stay at the hospital during part of their recovery, the visitor guidelines for inpatient rooms apply. Pre-op nurse will coordinate an appropriate time for 1 support person to accompany patient in pre-op.  This support person may not rotate.   Please refer to the [facility] website for the visitor guidelines for Inpatients (after your surgery is over and you are in a regular room).    Special instructions:     Pre-operative 5 CHG Bath Instructions   You can play a key role in reducing the risk of infection after surgery. Your skin needs to be as free of germs as possible. You can reduce the number of germs on your skin by washing with CHG (chlorhexidine gluconate) soap before surgery. CHG is an antiseptic soap that kills germs and continues to kill germs even after washing.   DO NOT use  if you have an allergy to chlorhexidine/CHG or antibacterial soaps. If your skin becomes reddened or irritated, stop using the CHG and notify one of our RNs at [phone].   Please shower with the CHG soap starting 4 days before surgery using the following schedule:     Please keep in mind the following:  DO NOT shave, including legs and underarms, starting the day of your first shower.   You may shave your face at any point before/day of surgery.  Place clean sheets on your bed the day you start using CHG soap. Use a clean washcloth (not used since being washed) for each shower. DO NOT sleep with pets once you start using the CHG.  CHG Shower Instructions:  If you choose to wash your hair and private area, wash first with your normal shampoo/soap.  After you use shampoo/soap, rinse your hair and body thoroughly to remove shampoo/soap residue.  Turn the water OFF and apply about 3 tablespoons (45 ml) of CHG soap to a CLEAN washcloth.  Apply CHG soap ONLY FROM YOUR NECK DOWN TO YOUR TOES (washing for 3-5 minutes)  DO NOT use CHG soap on face, private areas, open wounds, or sores.  Pay special attention to the area where your surgery is being performed.  If you are having back surgery, having someone wash your back for you may be helpful. Wait 2 minutes after CHG soap is applied, then you may rinse off the CHG soap.  Pat dry with a clean towel  Put on clean clothes/pajamas   If you choose to wear lotion, please use ONLY the CHG-compatible lotions on the back of this paper.     Additional instructions for the day of surgery: DO NOT APPLY any lotions, deodorants, cologne, or perfumes.   Put on clean/comfortable clothes.  Brush your teeth.  Ask your nurse before applying any prescription medications to the skin.      CHG Compatible Lotions   Aveeno Moisturizing lotion  Cetaphil Moisturizing Cream  Cetaphil Moisturizing Lotion  Clairol Herbal Essence Moisturizing Lotion, Dry  Skin  Clairol Herbal Essence Moisturizing Lotion, Extra Dry Skin  Clairol Herbal Essence Moisturizing Lotion, Normal Skin  Curel Age Defying Therapeutic Moisturizing Lotion with Alpha Hydroxy  Curel Extreme Care Body Lotion  Curel Soothing Hands Moisturizing Hand Lotion  Curel Therapeutic Moisturizing Cream, Fragrance-Free  Curel Therapeutic Moisturizing Lotion, Fragrance-Free  Curel Therapeutic Moisturizing Lotion, Original Formula  Eucerin Daily Replenishing Lotion  Eucerin Dry Skin Therapy Plus Alpha Hydroxy Crme  Eucerin Dry Skin Therapy Plus Alpha Hydroxy Lotion  Eucerin Original Crme  Eucerin Original Lotion  Eucerin Plus Crme Eucerin Plus Lotion  Eucerin TriLipid Replenishing Lotion  Keri Anti-Bacterial Hand Lotion  Keri Deep Conditioning Original Lotion Dry Skin Formula Softly Scented  Keri Deep Conditioning Original Lotion, Fragrance Free Sensitive Skin Formula  Keri Lotion Fast Absorbing Fragrance Free Sensitive Skin Formula  Keri Lotion Fast Absorbing Softly Scented Dry Skin Formula  Keri Original Lotion  Keri Skin Renewal Lotion Keri Silky Smooth Lotion  Keri Silky Smooth Sensitive Skin Lotion  Nivea Body Creamy Conditioning Oil  Nivea Body Extra Enriched Teacher, adult education Moisturizing Lotion Nivea Crme  Nivea Skin Firming Lotion  NutraDerm 30 Skin Lotion  NutraDerm Skin Lotion  NutraDerm Therapeutic Skin Cream  NutraDerm Therapeutic Skin Lotion  ProShield Protective Hand Cream  Provon moisturizing lotion

## 2023-11-08 ENCOUNTER — Other Ambulatory Visit: Payer: Self-pay

## 2023-11-08 ENCOUNTER — Encounter (HOSPITAL_COMMUNITY)
Admission: RE | Admit: 2023-11-08 | Discharge: 2023-11-08 | Disposition: A | Discharge status: Home / Self Care | Payer: Medicare Other | Source: Ambulatory Visit | Attending: Orthopedic Surgery | Admitting: Orthopedic Surgery

## 2023-11-08 ENCOUNTER — Encounter (HOSPITAL_COMMUNITY): Payer: Self-pay

## 2023-11-08 VITALS — BP 146/78 | HR 77 | Temp 98.4°F | Resp 18 | Ht 72.0 in | Wt 259.5 lb

## 2023-11-08 DIAGNOSIS — E1165 Type 2 diabetes mellitus with hyperglycemia: Secondary | ICD-10-CM | POA: Insufficient documentation

## 2023-11-08 DIAGNOSIS — Z01818 Encounter for other preprocedural examination: Secondary | ICD-10-CM | POA: Insufficient documentation

## 2023-11-08 HISTORY — DX: Unspecified osteoarthritis, unspecified site: M19.90

## 2023-11-08 LAB — CBC
HCT: 42.1 % (ref 39.0–52.0)
Hemoglobin: 13.9 g/dL (ref 13.0–17.0)
MCH: 32.7 pg (ref 26.0–34.0)
MCHC: 33 g/dL (ref 30.0–36.0)
MCV: 99.1 fL (ref 80.0–100.0)
Platelets: 249 10*3/uL (ref 150–400)
RBC: 4.25 MIL/uL (ref 4.22–5.81)
RDW: 13.2 % (ref 11.5–15.5)
WBC: 7.9 10*3/uL (ref 4.0–10.5)
nRBC: 0 % (ref 0.0–0.2)

## 2023-11-08 LAB — GLUCOSE, CAPILLARY: Glucose-Capillary: 155 mg/dL — ABNORMAL HIGH (ref 70–99)

## 2023-11-08 LAB — SURGICAL PCR SCREEN
MRSA, PCR: NEGATIVE
Staphylococcus aureus: NEGATIVE

## 2023-11-08 LAB — TYPE AND SCREEN
ABO/RH(D): O NEG
Antibody Screen: NEGATIVE

## 2023-11-08 LAB — BASIC METABOLIC PANEL
Anion gap: 9 (ref 5–15)
BUN: 16 mg/dL (ref 8–23)
CO2: 26 mmol/L (ref 22–32)
Calcium: 9.5 mg/dL (ref 8.9–10.3)
Chloride: 103 mmol/L (ref 98–111)
Creatinine, Ser: 0.94 mg/dL (ref 0.61–1.24)
GFR, Estimated: >60 mL/min (ref >60)
Glucose, Bld: 128 mg/dL — ABNORMAL HIGH (ref 70–99)
Potassium: 4.6 mmol/L (ref 3.5–5.1)
Sodium: 138 mmol/L (ref 135–145)

## 2023-11-08 LAB — HEMOGLOBIN A1C
Hgb A1c MFr Bld: 6.5 % — ABNORMAL HIGH (ref 4.8–5.6)
Mean Plasma Glucose: 139.85 mg/dL

## 2023-11-08 NOTE — Progress Notes (Signed)
PCP - Mattie Marlin, DO  Cardiologist - Clayborn Bigness, MD    PPM/ICD - denies Device Orders - n/a Rep Notified - n/a  Chest x-ray - denies EKG - 11-08-23 Stress Test - denies ECHO - denies Cardiac Cath - denies  Sleep Study - denies CPAP - n/a  Fasting Blood Sugar - Per patient blood sugar between 120-140. Per Patient highest 177 after three days of prednisone Checks Blood Sugar daily  Last dose of GLP1 agonist-  Semaglutide OZEMPIC  GLP1 instructions: Last dose 11-08-23 (due on Wednesday)  Blood Thinner Instructions: denies Aspirin Instructions:n/a  ERAS Protcol - clear liquids until 10:00  PRE-SURGERY G2-   COVID TEST- no   Anesthesia review: yes review EKG, Dm  Patient denies shortness of breath, fever, cough and chest pain at PAT appointment   All instructions explained to the patient, with a verbal understanding of the material. Patient agrees to go over the instructions while at home for a better understanding. Patient also instructed to self quarantine after being tested for COVID-19. The opportunity to ask questions was provided.

## 2023-11-14 NOTE — Progress Notes (Signed)
Pt made aware of surgery time change for 11/15/23 0730-1107, arrival 0530, stop eating food by midnight, finish the G2 drink and clear liquids by 0430, and to follow all previous instructions given.

## 2023-11-15 ENCOUNTER — Ambulatory Visit (HOSPITAL_BASED_OUTPATIENT_CLINIC_OR_DEPARTMENT_OTHER): Payer: Medicare Other | Admitting: Anesthesiology

## 2023-11-15 ENCOUNTER — Ambulatory Visit (HOSPITAL_COMMUNITY): Payer: Medicare Other

## 2023-11-15 ENCOUNTER — Ambulatory Visit (HOSPITAL_COMMUNITY): Payer: Medicare Other | Admitting: Physician Assistant

## 2023-11-15 ENCOUNTER — Observation Stay (HOSPITAL_COMMUNITY)
Admission: RE | Admit: 2023-11-15 | Discharge: 2023-11-16 | Disposition: A | Discharge status: Home / Self Care | Payer: Medicare Other | Attending: Orthopedic Surgery | Admitting: Orthopedic Surgery

## 2023-11-15 ENCOUNTER — Other Ambulatory Visit: Payer: Self-pay

## 2023-11-15 ENCOUNTER — Ambulatory Visit (HOSPITAL_COMMUNITY)
Admission: RE | Disposition: A | Discharge status: Home / Self Care | Payer: Self-pay | Source: Home / Self Care | Attending: Orthopedic Surgery

## 2023-11-15 ENCOUNTER — Encounter (HOSPITAL_COMMUNITY): Payer: Self-pay | Admitting: Orthopedic Surgery

## 2023-11-15 DIAGNOSIS — M5416 Radiculopathy, lumbar region: Secondary | ICD-10-CM

## 2023-11-15 DIAGNOSIS — M48061 Spinal stenosis, lumbar region without neurogenic claudication: Secondary | ICD-10-CM | POA: Insufficient documentation

## 2023-11-15 DIAGNOSIS — E119 Type 2 diabetes mellitus without complications: Secondary | ICD-10-CM | POA: Insufficient documentation

## 2023-11-15 DIAGNOSIS — M4316 Spondylolisthesis, lumbar region: Secondary | ICD-10-CM | POA: Diagnosis not present

## 2023-11-15 DIAGNOSIS — Z7984 Long term (current) use of oral hypoglycemic drugs: Secondary | ICD-10-CM | POA: Diagnosis not present

## 2023-11-15 DIAGNOSIS — Z79899 Other long term (current) drug therapy: Secondary | ICD-10-CM | POA: Insufficient documentation

## 2023-11-15 HISTORY — PX: TRANSFORAMINAL LUMBAR INTERBODY FUSION (TLIF) WITH PEDICLE SCREW FIXATION 1 LEVEL: SHX6141

## 2023-11-15 LAB — GLUCOSE, CAPILLARY
Glucose-Capillary: 113 mg/dL — ABNORMAL HIGH (ref 70–99)
Glucose-Capillary: 197 mg/dL — ABNORMAL HIGH (ref 70–99)
Glucose-Capillary: 128 mg/dL — ABNORMAL HIGH (ref 70–99)
Glucose-Capillary: 100 mg/dL — ABNORMAL HIGH (ref 70–99)

## 2023-11-15 SURGERY — TRANSFORAMINAL LUMBAR INTERBODY FUSION (TLIF) WITH PEDICLE SCREW FIXATION 1 LEVEL
Anesthesia: General | Site: Spine Lumbar | Laterality: Left

## 2023-11-15 MED ORDER — PROPOFOL 10 MG/ML IV BOLUS
INTRAVENOUS | Status: DC | PRN
  Administered 2023-11-15: 180 mg via INTRAVENOUS

## 2023-11-15 MED ORDER — ACETAMINOPHEN 10 MG/ML IV SOLN
INTRAVENOUS | Status: AC
  Filled 2023-11-15: qty 100

## 2023-11-15 MED ORDER — SUGAMMADEX SODIUM 200 MG/2ML IV SOLN
INTRAVENOUS | Status: DC | PRN
  Administered 2023-11-15: 200 mg via INTRAVENOUS

## 2023-11-15 MED ORDER — SENNOSIDES-DOCUSATE SODIUM 8.6-50 MG PO TABS: 1.0000 | ORAL_TABLET | Freq: Every evening | ORAL | Status: DC | PRN

## 2023-11-15 MED ORDER — FENTANYL CITRATE (PF) 100 MCG/2ML IJ SOLN: 25.0000 ug | INTRAMUSCULAR | Status: DC | PRN

## 2023-11-15 MED ORDER — HYDROCODONE-ACETAMINOPHEN 5-325 MG PO TABS
1.0000 | ORAL_TABLET | ORAL | Status: DC | PRN
  Administered 2023-11-16: 2 via ORAL
  Filled 2023-11-15: qty 2

## 2023-11-15 MED ORDER — BUPIVACAINE LIPOSOME 1.3 % IJ SUSP
INTRAMUSCULAR | Status: AC
  Filled 2023-11-15: qty 20

## 2023-11-15 MED ORDER — FLEET ENEMA RE ENEM: 1.0000 | ENEMA | Freq: Once | RECTAL | Status: DC | PRN

## 2023-11-15 MED ORDER — LACTATED RINGERS IV SOLN: INTRAVENOUS | Status: DC | PRN

## 2023-11-15 MED ORDER — STERILE WATER FOR IRRIGATION IR SOLN
Status: DC | PRN
  Administered 2023-11-15: 1000 mL

## 2023-11-15 MED ORDER — ACETAMINOPHEN 500 MG PO TABS: 1000.0000 mg | ORAL_TABLET | Freq: Once | ORAL | Status: DC | PRN

## 2023-11-15 MED ORDER — ROCURONIUM BROMIDE 10 MG/ML (PF) SYRINGE
PREFILLED_SYRINGE | INTRAVENOUS | Status: AC
  Filled 2023-11-15: qty 10

## 2023-11-15 MED ORDER — 0.9 % SODIUM CHLORIDE (POUR BTL) OPTIME
TOPICAL | Status: DC | PRN
  Administered 2023-11-15 (×2): 1000 mL

## 2023-11-15 MED ORDER — ORAL CARE MOUTH RINSE: 15.0000 mL | Freq: Once | OROMUCOSAL | Status: AC

## 2023-11-15 MED ORDER — BISACODYL 5 MG PO TBEC
5.0000 mg | DELAYED_RELEASE_TABLET | Freq: Every day | ORAL | Status: DC | PRN
Start: 2023-11-15 — End: 2023-11-16

## 2023-11-15 MED ORDER — METFORMIN HCL ER 500 MG PO TB24
1000.0000 mg | ORAL_TABLET | Freq: Two times a day (BID) | ORAL | Status: DC
  Administered 2023-11-15 – 2023-11-16 (×2): 1000 mg via ORAL
  Filled 2023-11-15 (×2): qty 2

## 2023-11-15 MED ORDER — PHENOL 1.4 % MT LIQD: 1.0000 | OROMUCOSAL | Status: DC | PRN

## 2023-11-15 MED ORDER — ACETAMINOPHEN 325 MG PO TABS: 650.0000 mg | ORAL_TABLET | ORAL | Status: DC | PRN

## 2023-11-15 MED ORDER — DOCUSATE SODIUM 100 MG PO CAPS
100.0000 mg | ORAL_CAPSULE | Freq: Two times a day (BID) | ORAL | Status: DC
  Administered 2023-11-15: 100 mg via ORAL
  Filled 2023-11-15 (×2): qty 1

## 2023-11-15 MED ORDER — DEXAMETHASONE SODIUM PHOSPHATE 10 MG/ML IJ SOLN
INTRAMUSCULAR | Status: DC | PRN
  Administered 2023-11-15: 5 mg via INTRAVENOUS

## 2023-11-15 MED ORDER — MORPHINE SULFATE (PF) 2 MG/ML IV SOLN: 1.0000 mg | INTRAVENOUS | Status: DC | PRN

## 2023-11-15 MED ORDER — SODIUM CHLORIDE 0.9% FLUSH: 3.0000 mL | INTRAVENOUS | Status: DC | PRN

## 2023-11-15 MED ORDER — POVIDONE-IODINE 7.5 % EX SOLN: Freq: Once | CUTANEOUS | Status: DC

## 2023-11-15 MED ORDER — FENTANYL CITRATE (PF) 250 MCG/5ML IJ SOLN
INTRAMUSCULAR | Status: DC | PRN
  Administered 2023-11-15: 125 ug via INTRAVENOUS
  Administered 2023-11-15: 25 ug via INTRAVENOUS

## 2023-11-15 MED ORDER — SODIUM CHLORIDE 0.9% FLUSH
3.0000 mL | Freq: Two times a day (BID) | INTRAVENOUS | Status: DC
  Administered 2023-11-15 (×2): 3 mL via INTRAVENOUS

## 2023-11-15 MED ORDER — THROMBIN 20000 UNITS EX KIT
PACK | CUTANEOUS | Status: DC | PRN
  Administered 2023-11-15: 20 mL via TOPICAL

## 2023-11-15 MED ORDER — ONDANSETRON HCL 4 MG/2ML IJ SOLN
INTRAMUSCULAR | Status: AC
  Filled 2023-11-15: qty 2

## 2023-11-15 MED ORDER — ACETAMINOPHEN 160 MG/5ML PO SOLN: 1000.0000 mg | Freq: Once | ORAL | Status: DC | PRN

## 2023-11-15 MED ORDER — HYDROMORPHONE HCL 1 MG/ML IJ SOLN
INTRAMUSCULAR | Status: DC | PRN
  Administered 2023-11-15: .25 mg via INTRAVENOUS

## 2023-11-15 MED ORDER — ONDANSETRON HCL 4 MG/2ML IJ SOLN: 4.0000 mg | Freq: Four times a day (QID) | INTRAMUSCULAR | Status: DC | PRN

## 2023-11-15 MED ORDER — OXYCODONE-ACETAMINOPHEN 5-325 MG PO TABS: 1.0000 | ORAL_TABLET | ORAL | Status: DC | PRN

## 2023-11-15 MED ORDER — ONDANSETRON HCL 4 MG/2ML IJ SOLN
INTRAMUSCULAR | Status: DC | PRN
  Administered 2023-11-15: 4 mg via INTRAVENOUS

## 2023-11-15 MED ORDER — SODIUM CHLORIDE 0.9% FLUSH
3.0000 mL | INTRAVENOUS | Status: DC | PRN
Start: 2023-11-15 — End: 2023-11-16

## 2023-11-15 MED ORDER — ACETAMINOPHEN 10 MG/ML IV SOLN: 1000.0000 mg | Freq: Once | INTRAVENOUS | Status: DC | PRN

## 2023-11-15 MED ORDER — ACETAMINOPHEN 650 MG RE SUPP: 650.0000 mg | RECTAL | Status: DC | PRN

## 2023-11-15 MED ORDER — CEFAZOLIN SODIUM-DEXTROSE 2-4 GM/100ML-% IV SOLN
2.0000 g | Freq: Three times a day (TID) | INTRAVENOUS | Status: AC
  Administered 2023-11-15 (×2): 2 g via INTRAVENOUS
  Filled 2023-11-15 (×2): qty 100

## 2023-11-15 MED ORDER — CEFAZOLIN SODIUM-DEXTROSE 2-4 GM/100ML-% IV SOLN
2.0000 g | INTRAVENOUS | Status: AC
  Administered 2023-11-15: 2 g via INTRAVENOUS
  Filled 2023-11-15: qty 100

## 2023-11-15 MED ORDER — ADULT MULTIVITAMIN W/MINERALS CH
1.0000 | ORAL_TABLET | Freq: Every day | ORAL | Status: DC
  Administered 2023-11-16: 1 via ORAL
  Filled 2023-11-15: qty 1

## 2023-11-15 MED ORDER — MENTHOL 3 MG MT LOZG: 1.0000 | LOZENGE | OROMUCOSAL | Status: DC | PRN

## 2023-11-15 MED ORDER — LIDOCAINE HCL (CARDIAC) PF 100 MG/5ML IV SOSY
PREFILLED_SYRINGE | INTRAVENOUS | Status: DC | PRN
  Administered 2023-11-15: 80 mg via INTRATRACHEAL

## 2023-11-15 MED ORDER — TRAMADOL HCL 50 MG PO TABS
50.0000 mg | ORAL_TABLET | Freq: Four times a day (QID) | ORAL | Status: DC | PRN
Start: 2023-11-15 — End: 2023-11-16
  Administered 2023-11-15: 100 mg via ORAL
  Administered 2023-11-16: 50 mg via ORAL
  Filled 2023-11-15: qty 1
  Filled 2023-11-15: qty 2

## 2023-11-15 MED ORDER — BUPIVACAINE-EPINEPHRINE 0.25% -1:200000 IJ SOLN
INTRAMUSCULAR | Status: DC | PRN
  Administered 2023-11-15: 10 mL

## 2023-11-15 MED ORDER — FENTANYL CITRATE (PF) 250 MCG/5ML IJ SOLN
INTRAMUSCULAR | Status: AC
  Filled 2023-11-15: qty 5

## 2023-11-15 MED ORDER — ONDANSETRON HCL 4 MG PO TABS: 4.0000 mg | ORAL_TABLET | Freq: Four times a day (QID) | ORAL | Status: DC | PRN

## 2023-11-15 MED ORDER — MIDAZOLAM HCL 2 MG/2ML IJ SOLN
INTRAMUSCULAR | Status: DC | PRN
  Administered 2023-11-15: 2 mg via INTRAVENOUS

## 2023-11-15 MED ORDER — PHENYLEPHRINE 80 MCG/ML (10ML) SYRINGE FOR IV PUSH (FOR BLOOD PRESSURE SUPPORT)
PREFILLED_SYRINGE | INTRAVENOUS | Status: DC | PRN
  Administered 2023-11-15 (×3): 160 ug via INTRAVENOUS
  Administered 2023-11-15: 80 ug via INTRAVENOUS

## 2023-11-15 MED ORDER — CHLORHEXIDINE GLUCONATE 0.12 % MT SOLN
15.0000 mL | Freq: Once | OROMUCOSAL | Status: AC
Start: 2023-11-15 — End: 2023-11-15
  Administered 2023-11-15: 15 mL via OROMUCOSAL
  Filled 2023-11-15: qty 15

## 2023-11-15 MED ORDER — ACETAMINOPHEN 10 MG/ML IV SOLN
1000.0000 mg | Freq: Once | INTRAVENOUS | Status: AC
  Administered 2023-11-15: 1000 mg via INTRAVENOUS

## 2023-11-15 MED ORDER — BUPIVACAINE-EPINEPHRINE (PF) 0.25% -1:200000 IJ SOLN
INTRAMUSCULAR | Status: AC
  Filled 2023-11-15: qty 30

## 2023-11-15 MED ORDER — THROMBIN 20000 UNITS EX SOLR
CUTANEOUS | Status: AC
  Filled 2023-11-15: qty 20000

## 2023-11-15 MED ORDER — METHOCARBAMOL 500 MG PO TABS
500.0000 mg | ORAL_TABLET | Freq: Four times a day (QID) | ORAL | Status: DC | PRN
  Administered 2023-11-15 – 2023-11-16 (×2): 500 mg via ORAL
  Filled 2023-11-15 (×2): qty 1

## 2023-11-15 MED ORDER — LIDOCAINE 2% (20 MG/ML) 5 ML SYRINGE
INTRAMUSCULAR | Status: AC
  Filled 2023-11-15: qty 5

## 2023-11-15 MED ORDER — SODIUM CHLORIDE 0.9 % IV SOLN
250.0000 mL | INTRAVENOUS | Status: DC
  Administered 2023-11-15: 250 mL via INTRAVENOUS

## 2023-11-15 MED ORDER — ROCURONIUM BROMIDE 10 MG/ML (PF) SYRINGE
PREFILLED_SYRINGE | INTRAVENOUS | Status: AC
Start: 2023-11-15 — End: ?
  Filled 2023-11-15: qty 10

## 2023-11-15 MED ORDER — VITAMIN D 25 MCG (1000 UNIT) PO TABS
1000.0000 [IU] | ORAL_TABLET | Freq: Every day | ORAL | Status: DC
  Administered 2023-11-16: 1000 [IU] via ORAL
  Filled 2023-11-15: qty 1

## 2023-11-15 MED ORDER — PHENYLEPHRINE 80 MCG/ML (10ML) SYRINGE FOR IV PUSH (FOR BLOOD PRESSURE SUPPORT)
PREFILLED_SYRINGE | INTRAVENOUS | Status: AC
  Filled 2023-11-15: qty 10

## 2023-11-15 MED ORDER — ALUM & MAG HYDROXIDE-SIMETH 200-200-20 MG/5ML PO SUSP
30.0000 mL | Freq: Four times a day (QID) | ORAL | Status: DC | PRN
Start: 2023-11-15 — End: 2023-11-16

## 2023-11-15 MED ORDER — PROPOFOL 10 MG/ML IV BOLUS
INTRAVENOUS | Status: AC
  Filled 2023-11-15: qty 20

## 2023-11-15 MED ORDER — MIDAZOLAM HCL 2 MG/2ML IJ SOLN
INTRAMUSCULAR | Status: AC
  Filled 2023-11-15: qty 2

## 2023-11-15 MED ORDER — HYDROMORPHONE HCL 1 MG/ML IJ SOLN
INTRAMUSCULAR | Status: AC
  Filled 2023-11-15: qty 0.5

## 2023-11-15 MED ORDER — VITAMIN C 500 MG PO TABS
500.0000 mg | ORAL_TABLET | Freq: Every day | ORAL | Status: DC
  Administered 2023-11-16: 500 mg via ORAL
  Filled 2023-11-15: qty 1

## 2023-11-15 MED ORDER — SODIUM CHLORIDE 0.9% FLUSH
3.0000 mL | Freq: Two times a day (BID) | INTRAVENOUS | Status: DC
Start: 2023-11-15 — End: 2023-11-16

## 2023-11-15 MED ORDER — ZOLPIDEM TARTRATE 5 MG PO TABS: 5.0000 mg | ORAL_TABLET | Freq: Every evening | ORAL | Status: DC | PRN

## 2023-11-15 MED ORDER — ROCURONIUM BROMIDE 100 MG/10ML IV SOLN
INTRAVENOUS | Status: DC | PRN
  Administered 2023-11-15 (×4): 10 mg via INTRAVENOUS
  Administered 2023-11-15: 20 mg via INTRAVENOUS
  Administered 2023-11-15: 80 mg via INTRAVENOUS

## 2023-11-15 MED ORDER — BUPIVACAINE-EPINEPHRINE (PF) 0.25% -1:200000 IJ SOLN
INTRAMUSCULAR | Status: DC | PRN
  Administered 2023-11-15: 40 mL

## 2023-11-15 MED ORDER — METHOCARBAMOL 1000 MG/10ML IJ SOLN: 500.0000 mg | Freq: Four times a day (QID) | INTRAMUSCULAR | Status: DC | PRN

## 2023-11-15 SURGICAL SUPPLY — 86 items
BAG COUNTER SPONGE SURGICOUNT (BAG) ×1 IMPLANT
BENZOIN TINCTURE PRP APPL 2/3 (GAUZE/BANDAGES/DRESSINGS) ×1 IMPLANT
BLADE CLIPPER SURG (BLADE) IMPLANT
BUR PRESCISION 1.7 ELITE (BURR) ×1 IMPLANT
BUR ROUND FLUTED 5 RND (BURR) ×1 IMPLANT
BUR ROUND PRECISION 4.0 (BURR) IMPLANT
BUR SABER RD CUTTING 3.0 (BURR) IMPLANT
CAGE SABLE 10X26 6-12 8D (Cage) IMPLANT
CANNULA GRAFT BNE VG PRE-FILL (Bone Implant) IMPLANT
CNTNR URN SCR LID CUP LEK RST (MISCELLANEOUS) ×1 IMPLANT
COVER BACK TABLE 60X90IN (DRAPES) ×1 IMPLANT
COVER MAYO STAND STRL (DRAPES) ×2 IMPLANT
COVER SURGICAL LIGHT HANDLE (MISCELLANEOUS) ×1 IMPLANT
DISPENSER GRAFT BNE VG (MISCELLANEOUS) IMPLANT
DISPENSER VIVIGEN BONE GRAFT (MISCELLANEOUS) ×1 IMPLANT
DRAIN CHANNEL 15F RND FF W/TCR (WOUND CARE) IMPLANT
DRAPE C-ARM 42X72 X-RAY (DRAPES) ×1 IMPLANT
DRAPE C-ARMOR (DRAPES) IMPLANT
DRAPE POUCH INSTRU U-SHP 10X18 (DRAPES) ×1 IMPLANT
DRAPE SURG 17X23 STRL (DRAPES) ×4 IMPLANT
DURAPREP 26ML APPLICATOR (WOUND CARE) ×1 IMPLANT
ELECT BLADE 4.0 EZ CLEAN MEGAD (MISCELLANEOUS) ×1 IMPLANT
ELECT CAUTERY BLADE 6.4 (BLADE) ×1 IMPLANT
ELECT PENCIL ROCKER SW 15FT (MISCELLANEOUS) IMPLANT
ELECT REM PT RETURN 9FT ADLT (ELECTROSURGICAL) ×1 IMPLANT
ELECTRODE BLDE 4.0 EZ CLN MEGD (MISCELLANEOUS) ×1 IMPLANT
ELECTRODE REM PT RTRN 9FT ADLT (ELECTROSURGICAL) ×1 IMPLANT
EVACUATOR SILICONE 100CC (DRAIN) IMPLANT
FILTER STRAW FLUID ASPIR (MISCELLANEOUS) ×1 IMPLANT
GAUZE 4X4 16PLY ~~LOC~~+RFID DBL (SPONGE) ×1 IMPLANT
GAUZE SPONGE 4X4 12PLY STRL (GAUZE/BANDAGES/DRESSINGS) ×1 IMPLANT
GLOVE BIO SURGEON STRL SZ 6.5 (GLOVE) ×1 IMPLANT
GLOVE BIO SURGEON STRL SZ8 (GLOVE) ×1 IMPLANT
GLOVE BIOGEL PI IND STRL 7.0 (GLOVE) ×1 IMPLANT
GLOVE BIOGEL PI IND STRL 8 (GLOVE) ×1 IMPLANT
GLOVE SURG ENC MOIS LTX SZ6.5 (GLOVE) ×1 IMPLANT
GOWN STRL REUS W/ TWL LRG LVL3 (GOWN DISPOSABLE) ×2 IMPLANT
GOWN STRL REUS W/ TWL XL LVL3 (GOWN DISPOSABLE) ×1 IMPLANT
GRAFT BONE CANNULA VIVIGEN 3 (Bone Implant) ×2 IMPLANT
IV CATH 14GX2 1/4 (CATHETERS) ×1 IMPLANT
KIT BASIN OR (CUSTOM PROCEDURE TRAY) ×1 IMPLANT
KIT POSITION SURG JACKSON T1 (MISCELLANEOUS) ×1 IMPLANT
KIT TURNOVER KIT B (KITS) ×1 IMPLANT
MARKER SKIN DUAL TIP RULER LAB (MISCELLANEOUS) ×2 IMPLANT
NDL 18GX1X1/2 (RX/OR ONLY) (NEEDLE) ×1 IMPLANT
NDL 22X1.5 STRL (OR ONLY) (MISCELLANEOUS) ×2 IMPLANT
NDL HYPO 25GX1X1/2 BEV (NEEDLE) ×1 IMPLANT
NDL SPNL 18GX3.5 QUINCKE PK (NEEDLE) ×2 IMPLANT
NEEDLE 18GX1X1/2 (RX/OR ONLY) (NEEDLE) ×1 IMPLANT
NEEDLE 22X1.5 STRL (OR ONLY) (MISCELLANEOUS) ×1 IMPLANT
NEEDLE HYPO 25GX1X1/2 BEV (NEEDLE) ×1 IMPLANT
NEEDLE SPNL 18GX3.5 QUINCKE PK (NEEDLE) ×2 IMPLANT
NS IRRIG 1000ML POUR BTL (IV SOLUTION) ×1 IMPLANT
PACK LAMINECTOMY ORTHO (CUSTOM PROCEDURE TRAY) ×1 IMPLANT
PACK UNIVERSAL I (CUSTOM PROCEDURE TRAY) ×1 IMPLANT
PAD ARMBOARD 7.5X6 YLW CONV (MISCELLANEOUS) ×2 IMPLANT
PATTIES SURGICAL .5 X1 (DISPOSABLE) ×1 IMPLANT
PATTIES SURGICAL .5X1.5 (GAUZE/BANDAGES/DRESSINGS) ×1 IMPLANT
PUTTY DBX 2.5CC (Putty) ×1 IMPLANT
PUTTY DBX 2.5CC DEPUY (Putty) IMPLANT
ROD PRE BENT EXP 40MM (Rod) IMPLANT
ROD PRE BENT EXPEDIUM 35MM (Rod) IMPLANT
SCREW CORTICAL VIPER 7X35 (Screw) IMPLANT
SCREW SET SINGLE INNER (Screw) IMPLANT
SCREW VIPER CORT FIX 6.00X30 (Screw) IMPLANT
SPONGE INTESTINAL PEANUT (DISPOSABLE) ×1 IMPLANT
SPONGE SURGIFOAM ABS GEL 100 (HEMOSTASIS) ×1 IMPLANT
STRIP CLOSURE SKIN 1/2X4 (GAUZE/BANDAGES/DRESSINGS) ×2 IMPLANT
SURGIFLO W/THROMBIN 8M KIT (HEMOSTASIS) IMPLANT
SUT MNCRL AB 4-0 PS2 18 (SUTURE) ×1 IMPLANT
SUT VIC AB 0 CT1 18XCR BRD 8 (SUTURE) ×1 IMPLANT
SUT VIC AB 1 CT1 18XCR BRD 8 (SUTURE) ×1 IMPLANT
SUT VIC AB 2-0 CT2 18 VCP726D (SUTURE) ×1 IMPLANT
SYR 20ML LL LF (SYRINGE) ×2 IMPLANT
SYR BULB IRRIG 60ML STRL (SYRINGE) ×1 IMPLANT
SYR CONTROL 10ML LL (SYRINGE) ×2 IMPLANT
SYR TB 1ML LUER SLIP (SYRINGE) ×1 IMPLANT
TAP EXPEDIUM DL 4.35 (INSTRUMENTS) IMPLANT
TAP EXPEDIUM DL 5.0 (INSTRUMENTS) IMPLANT
TAP EXPEDIUM DL 6.0 (INSTRUMENTS) IMPLANT
TAP EXPEDIUM DL 7X2 (INSTRUMENTS) IMPLANT
TAPE CLOTH 4X10 WHT NS (GAUZE/BANDAGES/DRESSINGS) IMPLANT
TRAY FOLEY MTR SLVR 16FR STAT (SET/KITS/TRAYS/PACK) ×1 IMPLANT
TUBE FUNNEL GL DISP (ORTHOPEDIC DISPOSABLE SUPPLIES) IMPLANT
WATER STERILE IRR 1000ML POUR (IV SOLUTION) ×1 IMPLANT
YANKAUER SUCT BULB TIP NO VENT (SUCTIONS) ×1 IMPLANT

## 2023-11-15 NOTE — Op Note (Signed)
PATIENT NAME: Joe Todd   MEDICAL RECORD NO.:   [id number]   DATE OF BIRTH: 1956/09/09   DATE OF PROCEDURE: 11/15/2023                               OPERATIVE REPORT     PREOPERATIVE DIAGNOSES: 1. Bilateral lumbar radiculopathy 2. L4-5 spinal stenosis 3. L4-5 spondylolisthesis   POSTOPERATIVE DIAGNOSES: 1. Bilateral lumbar radiculopathy 2. L4-5 spinal stenosis 3. L4-5 spondylolisthesis   PROCEDURES: 1. L4/5 decompression 2. Left-sided L4-5 transforaminal lumbar interbody fusion. 3. Right-sided L4-5 posterolateral fusion. 4. Insertion of interbody device x1 (Globus expandable intervertebral spacer). 5. Placement of segmental posterior instrumentation L4, L5 bilaterally  6. Use of local autograft. 7. Use of morselized allograft - Vivigen 8. Intraoperative use of fluoroscopy.   SURGEON:  Estill Bamberg, MD.   ASSISTANTJason Coop, PA-C.   ANESTHESIA:  General endotracheal anesthesia.   COMPLICATIONS:  None.   DISPOSITION:  Stable.   ESTIMATED BLOOD LOSS:  100cc   INDICATIONS FOR SURGERY:  Briefly,  Mr. Trella is a pleasant 68 year old male who did present to me with severe and ongoing pain in the right and left legs. I did feel that the symptoms were secondary to the findings noted above.   The patient failed conservative care and did wish to proceed with the procedure  noted above.   OPERATIVE DETAILS:  On 11/15/2023, the patient was brought to surgery and general endotracheal anesthesia was administered.  The patient was placed prone on a well-padded flat Jackson bed with a spinal frame.  Antibiotics were given and a time-out procedure was performed. The back was prepped and draped in the usual fashion.  A midline incision was made overlying the L4-5 intervertebral spaces.  The fascia was incised at the midline.  The paraspinal musculature was bluntly swept laterally.  Anatomic landmarks for the pedicles were exposed. Using fluoroscopy, I did cannulate the  L4 and L5 pedicles bilaterally, using a medial to lateral cortical trajectory technique.  At this point, a 7 x 35 mm screw was placed in the left pedicle, using a standard straight trajectory technique, as this did result in superior purchase of the vertebral body, as compared to the cortical technique used at L5.  At L5 on the right, a 6 x 30 mm screw was placed, and a 40 mm rod was placed into the tulip heads of the screws, and caps were also placed.  Distraction was then applied across the L4-5 intervertebral space, and the caps were then provisionally tightened.  On the left side, bone wax was placed into the cannulated pedicle holes.  I then proceeded with the decompressive aspect of the procedure at the L4-5 level.  A partial facetectomy was performed bilaterally at L4-5, decompressing the L4-5 intervertebral space.  I was very pleased with the decompression. With an assistant holding medial retraction of the traversing left L5 nerve, I did perform an annulotomy at the posterolateral aspect of the L4-5 intervertebral space. I then used a series of curettes and pituitary rongeurs to perform a thorough and complete intervertebral diskectomy.  The intervertebral space was then liberally packed with autograft as well as allograft in the form of Vivigen, as was the appropriate-sized intervertebral spacer.  The spacer was then tamped into position in the usual fashion, and expanded to 10.2 mm in height. I was very pleased with the press-fit of the spacer.  I then placed 6  mm screws on the left at L4 and L5. A 40-mm rod was then placed and caps were placed. The distraction was then released on the contralateral side.  All caps were then locked.  The wound was copiously irrigated with a total of approximately 3 L prior to placing the bone graft.  Additional autograft and allograft was then packed into the posterolateral gutter on the right side to help aid in the success of the fusion.  The wound was   explored for any undue bleeding and there was no substantial bleeding encountered.  Gel-Foam was placed over the laminectomy site.  The wound was then closed in layers using #1 Vicryl followed by 2-0 Vicryl, followed by 4-0 Monocryl.  Benzoin and Steri-Strips were applied followed by sterile dressing.     Of note, Jason Coop was my assistant throughout surgery, and did aid in retraction, suctioning, the decompression, placement of the hardware, and closure.     Estill Bamberg, MD

## 2023-11-15 NOTE — Plan of Care (Signed)

## 2023-11-15 NOTE — Anesthesia Preprocedure Evaluation (Signed)
Anesthesia Evaluation  Patient identified by MRN, date of birth, ID band Patient awake    Reviewed: Allergy & Precautions, NPO status , Patient's Chart, lab work & pertinent test results  History of Anesthesia Complications Negative for: history of anesthetic complications  Airway Mallampati: III  TM Distance: >3 FB Neck ROM: Full    Dental  (+) Edentulous Upper, Dental Advisory Given   Pulmonary neg shortness of breath, neg sleep apnea, neg COPD, neg recent URI   breath sounds clear to auscultation       Cardiovascular negative cardio ROS  Rhythm:Regular     Neuro/Psych neg Seizures  Neuromuscular disease  negative psych ROS   GI/Hepatic negative GI ROS, Neg liver ROS,,,  Endo/Other  diabetes, Type 2    Renal/GU negative Renal ROS     Musculoskeletal  (+) Arthritis ,    Abdominal   Peds  Hematology negative hematology ROS (+) Lab Results      Component                Value               Date                      WBC                      7.9                 11/08/2023                HGB                      13.9                11/08/2023                HCT                      42.1                11/08/2023                MCV                      99.1                11/08/2023                PLT                      249                 11/08/2023              Anesthesia Other Findings   Reproductive/Obstetrics                              Anesthesia Physical Anesthesia Plan  ASA: 2  Anesthesia Plan: General   Post-op Pain Management: Ofirmev IV (intra-op)*   Induction: Intravenous  PONV Risk Score and Plan: 3 and Ondansetron and Dexamethasone  Airway Management Planned: Oral ETT  Additional Equipment: None  Intra-op Plan:   Post-operative Plan: Extubation in OR  Informed Consent: I have reviewed the patients History and Physical, chart, labs and discussed the procedure  including the risks, benefits and  alternatives for the proposed anesthesia with the patient or authorized representative who has indicated his/her understanding and acceptance.     Dental advisory given  Plan Discussed with: CRNA  Anesthesia Plan Comments:          Anesthesia Quick Evaluation

## 2023-11-15 NOTE — Anesthesia Procedure Notes (Addendum)
Procedure Name: Intubation Date/Time: 11/15/2023 8:01 AM  Performed by: Thomasene Ripple, CRNAPre-anesthesia Checklist: Patient identified, Emergency Drugs available, Suction available and Patient being monitored Patient Re-evaluated:Patient Re-evaluated prior to induction Oxygen Delivery Method: Circle System Utilized Preoxygenation: Pre-oxygenation with 100% oxygen Induction Type: IV induction Ventilation: Mask ventilation without difficulty, Oral airway inserted - appropriate to patient size and Two handed mask ventilation required Laryngoscope Size: Miller and 3 Grade View: Grade I Tube type: Oral Tube size: 8.0 mm Number of attempts: 1 Airway Equipment and Method: Stylet and Oral airway Placement Confirmation: ETT inserted through vocal cords under direct vision, positive ETCO2 and breath sounds checked- equal and bilateral Secured at: 23 cm Tube secured with: Tape Dental Injury: Teeth and Oropharynx as per pre-operative assessment

## 2023-11-15 NOTE — Anesthesia Postprocedure Evaluation (Signed)
Anesthesia Post Note  Patient: Joe Todd  Procedure(s) Performed: LEFT-SIDED LUMBAR 4- LUMBAR 5 TRANSFORAMINAL LUMBAR INTERBODY FUSION AND DECOMPRESSION WITH INSTRUMENTATION AND ALLOGRAFT (Left: Spine Lumbar)     Patient location during evaluation: PACU Anesthesia Type: General Level of consciousness: awake and alert Pain management: pain level controlled Vital Signs Assessment: post-procedure vital signs reviewed and stable Respiratory status: spontaneous breathing, nonlabored ventilation and respiratory function stable Cardiovascular status: blood pressure returned to baseline and stable Postop Assessment: no apparent nausea or vomiting Anesthetic complications: no   No notable events documented.  Last Vitals:  Vitals:   11/15/23 1312 11/15/23 1641  BP: (!) 140/85 134/75  Pulse: 85 79  Resp: 20 18  Temp: 36.5 C 37.3 C  SpO2: 98% 94%                  Aniela Caniglia

## 2023-11-15 NOTE — H&P (Signed)
PREOPERATIVE H&P  Chief Complaint: Left leg pain  HPI: Joe Todd is a 68 y.o. male who presents with ongoing pain in the left leg  MRI reveals severe stenosis and instability at L4/5  Patient has failed multiple forms of conservative care and continues to have pain (see office notes for additional details regarding the patient's full course of treatment)  Past Medical History:  Diagnosis Date   Arthritis    Diabetes mellitus without complication (HCC)    NSAID long-term use 01/23/2017   Advised he try to use Tylenol 1000 mg q8 to avoid long term complications. He is currently taking 800 mg in the morning and two aleve PM at night.    Past Surgical History:  Procedure Laterality Date   Bilateral shoulder surgery     CARPECTOMY Left 10/04/2019   Procedure: LEFT WRIST PROXIMAL ROW CARPECTOMY, CAPITATE RESURFACING ARTHROPLASTY;  Surgeon: Dairl Ponder, MD;  Location: [facility];  Service: Orthopedics;  Laterality: Left;  AXILLARY BLOCK   HAND SURGERY     HERNIA REPAIR     left total hip replacement  09/2010   Social History   Socioeconomic History   Marital status: Married    Spouse name: Not on file   Number of children: Not on file   Years of education: Not on file   Highest education level: Not on file  Occupational History   Not on file  Tobacco Use   Smoking status: Never   Smokeless tobacco: Never  Vaping Use   Vaping status: Never Used  Substance and Sexual Activity   Alcohol use: Not Currently    Comment: occassionally   Drug use: No   Sexual activity: Not on file  Other Topics Concern   Not on file  Social History Narrative   Not on file   Social Drivers of Health   Financial Resource Strain: Not on file  Food Insecurity: Low Risk  (06/26/2023)   Received from [facility]   Hunger Vital Sign    Worried About Running Out of Food in the Last Year: Never true    Ran Out of Food in the Last Year: Never true  Transportation  Needs: No Transportation Needs (06/26/2023)   Received from [facility]    In the past 12 months, has lack of reliable transportation kept you from medical appointments, meetings, work or from getting things needed for daily living? : No  Physical Activity: Not on file  Stress: Not on file  Social Connections: Unknown (02/28/2022)   Received from [facility]   Social Network    Social Network: Not on file   Family History  Problem Relation Age of Onset   Cancer Mother    Hyperlipidemia Father    Hypertension Father    Heart disease Father    Diabetes Father    Allergies  Allergen Reactions   Oxycodone Itching    Fatigue/sleepiness   Penicillins Other (See Comments)    Not remember   Statins Other (See Comments)    Muscle cramps   Trulicity [Dulaglutide] Other (See Comments)    Throat tightness.      Prior to Admission medications   Medication Sig Start Date End Date Taking? Authorizing Provider  acetaminophen (TYLENOL) 650 MG CR tablet Take 1,300 mg by mouth in the morning and at bedtime.   Yes [provider]  Ascorbic Acid (VITAMIN C PO) Take 1 tablet by mouth in the  morning.   Yes [provider]  Cholecalciferol (VITAMIN D-3 PO) Take 1 tablet by mouth in the morning.   Yes [provider]  Glucosamine-Chondroitin (MOVE FREE PO) Take 1 tablet by mouth in the morning.   Yes [provider]  metFORMIN (GLUCOPHAGE-XR) 500 MG 24 hr tablet Take 1,000 mg by mouth in the morning and at bedtime.   Yes [provider]  methylPREDNISolone (MEDROL) 4 MG tablet Take 4 mg by mouth daily.   Yes [provider]  Multiple Vitamin (MULTIVITAMIN WITH MINERALS) TABS tablet Take 1 tablet by mouth in the morning.   Yes [provider]  Semaglutide (OZEMPIC, 0.25 OR 0.5 MG/DOSE, [place]) Inject 0.5 mg into the skin every Wednesday.   Yes [provider]  acetaminophen (TYLENOL) 500 MG tablet  Take 1,000 mg by mouth daily as needed (pain.).    [provider]  ONE TOUCH LANCETS MISC Use once a day: One Touch Verio flex 06/08/18   Ofilia Neas, PA-C     All other systems have been reviewed and were otherwise negative with the exception of those mentioned in the HPI and as above.  Physical Exam: Vitals:   11/15/23 0601  BP: (!) 154/85  Pulse: 69  Resp: 18  Temp: 98.4 F (36.9 C)  SpO2: 96%    Body mass index is 35.13 kg/m.  General: Alert, no acute distress Cardiovascular: No pedal edema Respiratory: No cyanosis, no use of accessory musculature Skin: No lesions in the area of chief complaint Neurologic: Sensation intact distally Psychiatric: Patient is competent for consent with normal mood and affect Lymphatic: No axillary or cervical lymphadenopathy   Assessment/Plan: LUMBAR RADICULOPATHY DUE TO L4/5 STENOSIS AND INSTABILITY Plan for Procedure(s): LEFT-SIDED LUMBAR 4- LUMBAR 5 TRANSFORAMINAL LUMBAR INTERBODY FUSION AND DECOMPRESSION WITH INSTRUMENTATION AND ALLOGRAFT   Jackelyn Hoehn, MD 11/15/2023 7:20 AM

## 2023-11-15 NOTE — Transfer of Care (Signed)
Immediate Anesthesia Transfer of Care Note  Patient: Joe Todd  Procedure(s) Performed: LEFT-SIDED LUMBAR 4- LUMBAR 5 TRANSFORAMINAL LUMBAR INTERBODY FUSION AND DECOMPRESSION WITH INSTRUMENTATION AND ALLOGRAFT (Left: Spine Lumbar)  Patient Location: PACU  Anesthesia Type:General  Level of Consciousness: awake  Airway & Oxygen Therapy: Patient Spontanous Breathing and Patient connected to nasal cannula oxygen  Post-op Assessment: Report given to RN and Post -op Vital signs reviewed and stable  Post vital signs: Reviewed and stable  Last Vitals:  Vitals Value Taken Time  BP 144/71 11/15/23 1151  Temp 36.6 C 11/15/23 1151  Pulse 87 11/15/23 1158  Resp 19 11/15/23 1158  SpO2 96 % 11/15/23 1158  Vitals shown include unfiled device data.  Last Pain:  Vitals:   11/15/23 0634  TempSrc:   PainSc: 0-No pain         Complications: No notable events documented.

## 2023-11-16 DIAGNOSIS — M4316 Spondylolisthesis, lumbar region: Secondary | ICD-10-CM | POA: Diagnosis not present

## 2023-11-16 LAB — GLUCOSE, CAPILLARY: Glucose-Capillary: 144 mg/dL — ABNORMAL HIGH (ref 70–99)

## 2023-11-16 MED ORDER — HYDROCODONE-ACETAMINOPHEN 5-325 MG PO TABS: 1.0000 | ORAL_TABLET | ORAL | 0 refills | Status: AC | PRN

## 2023-11-16 MED ORDER — METHOCARBAMOL 500 MG PO TABS: 500.0000 mg | ORAL_TABLET | Freq: Four times a day (QID) | ORAL | 2 refills | Status: AC | PRN

## 2023-11-16 MED FILL — Thrombin For Soln 20000 Unit: CUTANEOUS | Qty: 1 | Status: AC

## 2023-11-16 NOTE — Plan of Care (Signed)
Pt doing well. Pt given D/C instructions with verbal understanding. Rx's were sent to the pharmacy by MD. Pt's incision is clean and dry with no sign of infection. Pt's IV was removed prior to D/C. Pt D/C'd home via wheelchair per MD order. Pt is stable @ D/C and has no other needs at this time. Rema Fendt, RN

## 2023-11-16 NOTE — Progress Notes (Signed)
    Patient doing well  Patient denies leg pain   Physical Exam: Vitals:   11/16/23 0017 11/16/23 0320  BP: (!) 124/91 (!) 137/91  Pulse: 89 95  Resp: 20 20  Temp: 99.5 F (37.5 C) 98.9 F (37.2 C)  SpO2: 96% 96%    Dressing in place NVI  POD #1 s/p lumbar decompression and fusion, doing well  - up with PT/OT, encourage ambulation - Norco for pain, Robaxin for muscle spasms - d/c home today with f/u in 2 weeks

## 2023-11-16 NOTE — Evaluation (Signed)
Physical Therapy Evaluation/ discharge Patient Details Name: Joe Todd MRN: [id number] DOB: 02/19/1956 Today's Date: 11/16/2023  History of Present Illness  68 yo male admitted 1/29 for L4-5 transforaminal LIF due to LLE pain. PMhx: DM, Lt THA, hernia repair, bil shoulder sx  Clinical Impression  Pt pleasant and able to don brace EOB. Pt educated for all back precautions, transfers, mobility and activity progression. Pt lives with wife who will be able to assist as needed at home and pt states understanding of education. Pt  with slight decline in activity but functional for return home and no further therapy assistance required at this time with pt agreeable, will sign off.       If plan is discharge home, recommend the following: Assistance with cooking/housework   Can travel by private vehicle        Equipment Recommendations None recommended by PT  Recommendations for Other Services       Functional Status Assessment Patient has not had a recent decline in their functional status     Precautions / Restrictions Precautions Precautions: Back Precaution Booklet Issued: Yes (comment) Required Braces or Orthoses: Spinal Brace Spinal Brace: Thoracolumbosacral orthotic;Applied in sitting position      Mobility  Bed Mobility Overal bed mobility: Modified Independent             General bed mobility comments: cues for initial sequence with pt performing without assist    Transfers Overall transfer level: Modified independent                 General transfer comment: pt able to rise and sit at bed with increased time without physical assist    Ambulation/Gait Ambulation/Gait assistance: Modified independent (Device/Increase time) Gait Distance (Feet): 400 Feet Assistive device: None Gait Pattern/deviations: Step-through pattern, Decreased stride length   Gait velocity interpretation: 1.31 - 2.62 ft/sec, indicative of limited community ambulator    General Gait Details: pt with increased lateral sway with gait but pt unaware if this is a change from his baseline or present since his THA  Stairs Stairs: Yes Stairs assistance: Modified independent (Device/Increase time) Stair Management: One rail Right, Alternating pattern, Step to pattern, Forwards Number of Stairs: 4 General stair comments: pt initially used step to pattern then transitioned to alternating pattern for 4 stairs with use of rail  Wheelchair Mobility     Tilt Bed    Modified Rankin (Stroke Patients Only)       Balance Overall balance assessment: Mild deficits observed, not formally tested                                           Pertinent Vitals/Pain Pain Assessment Pain Assessment: 0-10 Pain Score: 3  Pain Location: back Pain Descriptors / Indicators: Aching Pain Intervention(s): Limited activity within patient's tolerance, Monitored during session, Repositioned    Home Living Family/patient expects to be discharged to:: Private residence Living Arrangements: Spouse/significant other Available Help at Discharge: Family;Available 24 hours/day Type of Home: House Home Access: Level entry       Home Layout: One level Home Equipment: Cane - single point      Prior Function Prior Level of Function : Independent/Modified Independent                     Extremity/Trunk Assessment   Upper Extremity Assessment Upper Extremity Assessment: Overall WFL for  tasks assessed    Lower Extremity Assessment Lower Extremity Assessment: Overall WFL for tasks assessed    Cervical / Trunk Assessment Cervical / Trunk Assessment: Back Surgery  Communication   Communication Communication: No apparent difficulties  Cognition Arousal: Alert Behavior During Therapy: WFL for tasks assessed/performed Overall Cognitive Status: Within Functional Limits for tasks assessed                                           General Comments      Exercises     Assessment/Plan    PT Assessment Patient does not need any further PT services  PT Problem List         PT Treatment Interventions      PT Goals (Current goals can be found in the Care Plan section)  Acute Rehab PT Goals PT Goal Formulation: All assessment and education complete, DC therapy    Frequency       Co-evaluation               AM-PAC PT "6 Clicks" Mobility  Outcome Measure Help needed turning from your back to your side while in a flat bed without using bedrails?: None Help needed moving from lying on your back to sitting on the side of a flat bed without using bedrails?: None Help needed moving to and from a bed to a chair (including a wheelchair)?: None Help needed standing up from a chair using your arms (e.g., wheelchair or bedside chair)?: None Help needed to walk in hospital room?: None Help needed climbing 3-5 steps with a railing? : None 6 Click Score: 24    End of Session Equipment Utilized During Treatment: Back brace Activity Tolerance: Patient tolerated treatment well Patient left: in chair;with call bell/phone within reach Nurse Communication: Mobility status PT Visit Diagnosis: Other abnormalities of gait and mobility (R26.89)    Time: 9147-8295 PT Time Calculation (min) (ACUTE ONLY): 27 min   Charges:   PT Evaluation $PT Eval Low Complexity: 1 Low PT Treatments $Therapeutic Activity: 8-22 mins PT General Charges $$ ACUTE PT VISIT: 1 Visit         Merryl Hacker, PT Acute Rehabilitation Services Office: [phone]   Enedina Finner Tieler Cournoyer 11/16/2023, 9:01 AM

## 2023-11-16 NOTE — Evaluation (Signed)
Occupational Therapy Evaluation and DC Summary  Patient Details Name: Joe Todd MRN: [id number] DOB: 1956-04-13 Today's Date: 11/16/2023   History of Present Illness 68 yo male admitted 1/29 for L4-5 transforaminal LIF due to LLE pain. PMhx: DM, Lt THA, hernia repair, bil shoulder sx   Clinical Impression   Pt admitted for above, reports some lingering buttocks tingling but LLE pain is resolved and some mild POB pain. Reinforced back precautions, pt needing some assist with socks but reports he will wear slip on shoes at home, able to dress self with compensatory strategies in standing (not sitting due to L hip deficits). Pt also educated on additional self care ADL strategies and car transfer, he has no further acute skilled OT needs. NO post acute OT needed.        If plan is discharge home, recommend the following: Assistance with cooking/housework;Assist for transportation    Functional Status Assessment  Patient has not had a recent decline in their functional status  Equipment Recommendations  None recommended by OT    Recommendations for Other Services       Precautions / Restrictions Precautions Precautions: Back Precaution Booklet Issued: Yes (comment) Precaution Comments: reviewed precautions Required Braces or Orthoses: Spinal Brace Spinal Brace: Thoracolumbosacral orthotic;Applied in sitting position Restrictions Weight Bearing Restrictions Per Provider Order: No      Mobility Bed Mobility               General bed mobility comments: up in recliner on arrival    Transfers Overall transfer level: Modified independent                        Balance Overall balance assessment: Mild deficits observed, not formally tested                                         ADL either performed or assessed with clinical judgement   ADL Overall ADL's : Needs assistance/impaired Eating/Feeding: Independent;Sitting   Grooming:  Standing;Supervision/safety Grooming Details (indicate cue type and reason): Pt recalling sink stratgies from back handout Upper Body Bathing: Modified independent;Sitting   Lower Body Bathing: Supervison/ safety;Sitting/lateral leans;With adaptive equipment Lower Body Bathing Details (indicate cue type and reason): uses LH sponge at baseline Upper Body Dressing : Standing;Minimal assistance Upper Body Dressing Details (indicate cue type and reason): needs min A to don brace Lower Body Dressing: Supervision/safety;Sit to/from stand Lower Body Dressing Details (indicate cue type and reason): Educated pt on bed propping and figure four position. Pt able to don pants and undies with supervision in standing using a figure four strategy. does need assist with socks but wears slip ons Toilet Transfer: Supervision/safety;Ambulation   Toileting- Clothing Manipulation and Hygiene: Supervision/safety       Functional mobility during ADLs: Modified independent General ADL Comments: Educated on wiping and car transfers     Vision         Perception         Praxis         Pertinent Vitals/Pain Pain Assessment Pain Assessment: Faces Faces Pain Scale: Hurts little more Pain Location: back Pain Descriptors / Indicators: Aching Pain Intervention(s): Monitored during session, Repositioned     Extremity/Trunk Assessment Upper Extremity Assessment Upper Extremity Assessment: Overall WFL for tasks assessed   Lower Extremity Assessment Lower Extremity Assessment: Overall WFL for tasks assessed   Cervical /  Trunk Assessment Cervical / Trunk Assessment: Back Surgery   Communication Communication Communication: No apparent difficulties   Cognition Arousal: Alert Behavior During Therapy: WFL for tasks assessed/performed Overall Cognitive Status: Within Functional Limits for tasks assessed                                       General Comments       Exercises      Shoulder Instructions      Home Living Family/patient expects to be discharged to:: Private residence Living Arrangements: Spouse/significant other Available Help at Discharge: Family;Available 24 hours/day Type of Home: House Home Access: Level entry     Home Layout: One level     Bathroom Shower/Tub: Producer, television/film/video: Standard     Home Equipment: Cane - single point;Shower seat          Prior Functioning/Environment Prior Level of Function : Independent/Modified Independent             Mobility Comments: ind ADLs Comments: ind        OT Problem List: Pain      OT Treatment/Interventions:      OT Goals(Current goals can be found in the care plan section) Acute Rehab OT Goals Patient Stated Goal: TO go home OT Goal Formulation: With patient Time For Goal Achievement: 12/01/23 Potential to Achieve Goals: Good  OT Frequency:      Co-evaluation              AM-PAC OT "6 Clicks" Daily Activity     Outcome Measure Help from another person eating meals?: None Help from another person taking care of personal grooming?: A Little Help from another person toileting, which includes using toliet, bedpan, or urinal?: A Little Help from another person bathing (including washing, rinsing, drying)?: None Help from another person to put on and taking off regular upper body clothing?: None Help from another person to put on and taking off regular lower body clothing?: A Little 6 Click Score: 21   End of Session Equipment Utilized During Treatment: Back brace Nurse Communication: Mobility status  Activity Tolerance: Patient tolerated treatment well Patient left: in chair;with call bell/phone within reach  OT Visit Diagnosis: Pain Pain - Right/Left:  (back)                Time: 1610-9604 OT Time Calculation (min): 24 min Charges:  OT General Charges $OT Visit: 1 Visit OT Evaluation $OT Eval Low Complexity: 1 Low OT Treatments $Self  Care/Home Management : 8-22 mins  11/16/2023  AB, OTR/L  Acute Rehabilitation Services  Office: [phone]   Tristan Schroeder 11/16/2023, 9:13 AM

## 2023-11-27 ENCOUNTER — Encounter (HOSPITAL_COMMUNITY): Payer: Self-pay | Admitting: Orthopedic Surgery

## 2023-11-30 NOTE — Discharge Summary (Signed)
Patient ID: Joe Todd MRN: [id number] DOB/AGE: 11-12-1955 68 y.o.  Admit date: 11/15/2023 Discharge date: 11/16/2023  Admission Diagnoses:  Principal Problem:   Radiculopathy of lumbar region   Discharge Diagnoses:  Same  Past Medical History:  Diagnosis Date   Arthritis    Diabetes mellitus without complication (HCC)    NSAID long-term use 01/23/2017   Advised he try to use Tylenol 1000 mg q8 to avoid long term complications. He is currently taking 800 mg in the morning and two aleve PM at night.     Surgeries: Procedure(s): LEFT-SIDED LUMBAR 4- LUMBAR 5 TRANSFORAMINAL LUMBAR INTERBODY FUSION AND DECOMPRESSION WITH INSTRUMENTATION AND ALLOGRAFT on 11/15/2023   Consultants: None  Discharged Condition: Improved  Hospital Course: Joe Todd is an 68 y.o. male who was admitted 11/15/2023 for operative treatment of Radiculopathy of lumbar region. Patient has severe unremitting pain that affects sleep, daily activities, and work/hobbies. After pre-op clearance the patient was taken to the operating room on 11/15/2023 and underwent  Procedure(s): LEFT-SIDED LUMBAR 4- LUMBAR 5 TRANSFORAMINAL LUMBAR INTERBODY FUSION AND DECOMPRESSION WITH INSTRUMENTATION AND ALLOGRAFT.    Patient was given perioperative antibiotics:  Anti-infectives (From admission, onward)    Start     Dose/Rate Route Frequency Ordered Stop   11/15/23 1600  ceFAZolin (ANCEF) IVPB 2g/100 mL premix        2 g 200 mL/hr over 30 Minutes Intravenous Every 8 hours 11/15/23 1231 11/15/23 2327   11/15/23 0600  ceFAZolin (ANCEF) IVPB 2g/100 mL premix        2 g 200 mL/hr over 30 Minutes Intravenous On call to O.R. 11/15/23 4098 11/15/23 0810        Patient was given sequential compression devices, early ambulation to prevent DVT.  Patient benefited maximally from hospital stay and there were no complications.    Recent vital signs: BP 115/69 (BP Location: Left Arm)   Pulse 99   Temp 99.6 F (37.6 C)  (Oral)   Resp 18   Ht 6' (1.829 m)   Wt 117.5 kg   SpO2 94%   BMI 35.13 kg/m    Discharge Medications:   Allergies as of 11/16/2023       Reactions   Oxycodone Itching   Fatigue/sleepiness   Penicillins Other (See Comments)   Not remember   Statins Other (See Comments)   Muscle cramps   Trulicity [dulaglutide] Other (See Comments)   Throat tightness.         Medication List     STOP taking these medications    methylPREDNISolone 4 MG tablet Commonly known as: MEDROL       TAKE these medications    acetaminophen 650 MG CR tablet Commonly known as: TYLENOL Take 1,300 mg by mouth in the morning and at bedtime.   acetaminophen 500 MG tablet Commonly known as: TYLENOL Take 1,000 mg by mouth daily as needed (pain.).   HYDROcodone-acetaminophen 5-325 MG tablet Commonly known as: NORCO/VICODIN Take 1-2 tablets by mouth every 4 (four) hours as needed for moderate pain (pain score 4-6) ((score 4 to 6)).   metFORMIN 500 MG 24 hr tablet Commonly known as: GLUCOPHAGE-XR Take 1,000 mg by mouth in the morning and at bedtime.   methocarbamol 500 MG tablet Commonly known as: ROBAXIN Take 1-2 tablets (500-1,000 mg total) by mouth every 6 (six) hours as needed for muscle spasms.   MOVE FREE PO Take 1 tablet by mouth in the morning.   multivitamin with minerals Tabs  tablet Take 1 tablet by mouth in the morning.   ONE TOUCH LANCETS Misc Use once a day: One Touch Verio flex   OZEMPIC (0.25 OR 0.5 MG/DOSE)  Inject 0.5 mg into the skin every Wednesday.   VITAMIN C PO Take 1 tablet by mouth in the morning.   VITAMIN D-3 PO Take 1 tablet by mouth in the morning.        Diagnostic Studies: DG Lumbar Spine 2-3 Views Result Date: 11/15/2023 CLINICAL DATA:  Elective surgery. EXAM: LUMBAR SPINE - 2-3 VIEW COMPARISON:  None Available. FINDINGS: Two fluoroscopic spot views of the lumbar spine submitted from the operating room. Posterior rod with intrapedicular screw  fusion and interbody spacer at L4-L5. Fluoroscopy time 53 seconds. Dose 45.31 mGy. IMPRESSION: Intraoperative fluoroscopy during L4-L5 fusion. Electronically Signed   By: Narda Rutherford M.D.   On: 11/15/2023 12:37   DG Lumbar Spine 1 View Result Date: 11/15/2023 CLINICAL DATA:  Elective surgery. EXAM: LUMBAR SPINE - 1 VIEW COMPARISON:  None Available. FINDINGS: Portable cross-table lateral view of the lumbar spine submitted from the operating room. Surgical instruments localize posteriorly at the L5 and S1 level. IMPRESSION: Surgical instruments localize posteriorly at the L5 and S1 level. Electronically Signed   By: Narda Rutherford M.D.   On: 11/15/2023 12:36   DG C-Arm 1-60 Min-No Report Result Date: 11/15/2023 Fluoroscopy was utilized by the requesting physician.  No radiographic interpretation.   DG C-Arm 1-60 Min-No Report Result Date: 11/15/2023 Fluoroscopy was utilized by the requesting physician.  No radiographic interpretation.   DG C-Arm 1-60 Min-No Report Result Date: 11/15/2023 Fluoroscopy was utilized by the requesting physician.  No radiographic interpretation.    Disposition: Discharge disposition: 01-Home or Self Care        POD #1 s/p lumbar decompression and fusion, doing well   - up with PT/OT, encourage ambulation - Norco for pain, Robaxin for muscle spasms -Scripts for pain sent to pharmacy electronically  -D/C instructions sheet printed and in chart -D/C today  -F/U in office 2 weeks   Signed: Eilene Ghazi Daemion Mcniel 11/30/2023, 10:45 AM

## 2024-01-17 ENCOUNTER — Other Ambulatory Visit: Payer: Self-pay | Admitting: Orthopedic Surgery

## 2024-01-17 DIAGNOSIS — Z9889 Other specified postprocedural states: Secondary | ICD-10-CM

## 2024-01-17 DIAGNOSIS — M5416 Radiculopathy, lumbar region: Secondary | ICD-10-CM

## 2024-01-26 ENCOUNTER — Ambulatory Visit
Admission: RE | Admit: 2024-01-26 | Discharge: 2024-01-26 | Disposition: A | Discharge status: Home / Self Care | Source: Ambulatory Visit | Attending: Orthopedic Surgery | Admitting: Orthopedic Surgery

## 2024-01-26 DIAGNOSIS — M5416 Radiculopathy, lumbar region: Secondary | ICD-10-CM

## 2024-01-29 LAB — COLOGUARD: COLOGUARD: NEGATIVE

## 2024-02-06 ENCOUNTER — Ambulatory Visit
Admission: RE | Admit: 2024-02-06 | Discharge: 2024-02-06 | Disposition: A | Discharge status: Home / Self Care | Source: Ambulatory Visit | Attending: Orthopedic Surgery | Admitting: Orthopedic Surgery

## 2024-02-06 DIAGNOSIS — Z9889 Other specified postprocedural states: Secondary | ICD-10-CM

## 2024-02-06 DIAGNOSIS — M5416 Radiculopathy, lumbar region: Secondary | ICD-10-CM
